# Patient Record
Sex: Female | Born: 1955 | Race: Black or African American | Hispanic: No | Marital: Single | Smoking: Former smoker
Health system: Southern US, Community
[De-identification: ages and names within clinical notes are randomized; demographics above are authoritative.]

## PROBLEM LIST (undated history)

## (undated) DIAGNOSIS — R7402 Elevation of levels of lactic acid dehydrogenase (LDH): Secondary | ICD-10-CM

## (undated) DIAGNOSIS — E785 Hyperlipidemia, unspecified: Secondary | ICD-10-CM

## (undated) DIAGNOSIS — E119 Type 2 diabetes mellitus without complications: Secondary | ICD-10-CM

## (undated) DIAGNOSIS — M1990 Unspecified osteoarthritis, unspecified site: Secondary | ICD-10-CM

## (undated) DIAGNOSIS — M2550 Pain in unspecified joint: Secondary | ICD-10-CM

## (undated) DIAGNOSIS — I10 Essential (primary) hypertension: Secondary | ICD-10-CM

## (undated) DIAGNOSIS — R740 Nonspecific elevation of levels of transaminase and lactic acid dehydrogenase [LDH]: Secondary | ICD-10-CM

## (undated) DIAGNOSIS — J45909 Unspecified asthma, uncomplicated: Secondary | ICD-10-CM

## (undated) DIAGNOSIS — E669 Obesity, unspecified: Secondary | ICD-10-CM

## (undated) DIAGNOSIS — R7401 Elevation of levels of liver transaminase levels: Secondary | ICD-10-CM

## (undated) DIAGNOSIS — K219 Gastro-esophageal reflux disease without esophagitis: Secondary | ICD-10-CM

## (undated) HISTORY — DX: Pain in unspecified joint: M25.50

## (undated) HISTORY — DX: Essential (primary) hypertension: I10

## (undated) HISTORY — DX: Hyperlipidemia, unspecified: E78.5

## (undated) HISTORY — PX: TOE SURGERY: SHX1073

## (undated) HISTORY — DX: Gastro-esophageal reflux disease without esophagitis: K21.9

## (undated) HISTORY — DX: Elevation of levels of lactic acid dehydrogenase (LDH): R74.02

## (undated) HISTORY — PX: ABDOMINAL HYSTERECTOMY: SHX81

## (undated) HISTORY — DX: Elevation of levels of liver transaminase levels: R74.01

## (undated) HISTORY — DX: Obesity, unspecified: E66.9

## (undated) HISTORY — DX: Nonspecific elevation of levels of transaminase and lactic acid dehydrogenase (ldh): R74.0

## (undated) HISTORY — PX: KNEE SURGERY: SHX244

## (undated) HISTORY — DX: Type 2 diabetes mellitus without complications: E11.9

## (undated) HISTORY — DX: Unspecified asthma, uncomplicated: J45.909

## (undated) HISTORY — DX: Unspecified osteoarthritis, unspecified site: M19.90

---

## 1997-10-12 ENCOUNTER — Ambulatory Visit (HOSPITAL_COMMUNITY): Admission: RE | Admit: 1997-10-12 | Discharge: 1997-10-12 | Payer: Self-pay | Admitting: Internal Medicine

## 1997-12-25 ENCOUNTER — Ambulatory Visit (HOSPITAL_COMMUNITY): Admission: RE | Admit: 1997-12-25 | Discharge: 1997-12-25 | Payer: Self-pay | Admitting: Internal Medicine

## 1997-12-25 ENCOUNTER — Encounter: Payer: Self-pay | Admitting: Neurology

## 1999-05-09 ENCOUNTER — Encounter: Admission: RE | Admit: 1999-05-09 | Discharge: 1999-05-09 | Payer: Self-pay | Admitting: Internal Medicine

## 1999-05-09 ENCOUNTER — Encounter: Payer: Self-pay | Admitting: Internal Medicine

## 1999-07-04 ENCOUNTER — Encounter: Payer: Self-pay | Admitting: Urology

## 1999-07-04 ENCOUNTER — Encounter: Admission: RE | Admit: 1999-07-04 | Discharge: 1999-07-04 | Payer: Self-pay | Admitting: Urology

## 1999-07-12 ENCOUNTER — Ambulatory Visit (HOSPITAL_BASED_OUTPATIENT_CLINIC_OR_DEPARTMENT_OTHER): Admission: RE | Admit: 1999-07-12 | Discharge: 1999-07-12 | Payer: Self-pay | Admitting: Urology

## 1999-07-12 ENCOUNTER — Encounter (INDEPENDENT_AMBULATORY_CARE_PROVIDER_SITE_OTHER): Payer: Self-pay | Admitting: Specialist

## 1999-08-15 ENCOUNTER — Other Ambulatory Visit: Admission: RE | Admit: 1999-08-15 | Discharge: 1999-08-15 | Payer: Self-pay | Admitting: Obstetrics and Gynecology

## 1999-10-10 ENCOUNTER — Ambulatory Visit (HOSPITAL_COMMUNITY): Admission: RE | Admit: 1999-10-10 | Discharge: 1999-10-10 | Payer: Self-pay | Admitting: Urology

## 1999-10-10 ENCOUNTER — Encounter: Payer: Self-pay | Admitting: Urology

## 2001-09-27 ENCOUNTER — Encounter: Admission: RE | Admit: 2001-09-27 | Discharge: 2001-09-27 | Payer: Self-pay | Admitting: Internal Medicine

## 2001-09-27 ENCOUNTER — Encounter: Payer: Self-pay | Admitting: Internal Medicine

## 2001-10-09 ENCOUNTER — Encounter: Payer: Self-pay | Admitting: Internal Medicine

## 2001-10-09 ENCOUNTER — Encounter: Admission: RE | Admit: 2001-10-09 | Discharge: 2001-10-09 | Payer: Self-pay | Admitting: Internal Medicine

## 2002-07-10 ENCOUNTER — Encounter: Payer: Self-pay | Admitting: Internal Medicine

## 2002-07-10 ENCOUNTER — Encounter: Admission: RE | Admit: 2002-07-10 | Discharge: 2002-07-10 | Payer: Self-pay | Admitting: Internal Medicine

## 2003-04-09 ENCOUNTER — Other Ambulatory Visit: Admission: RE | Admit: 2003-04-09 | Discharge: 2003-04-09 | Payer: Self-pay | Admitting: Obstetrics and Gynecology

## 2003-09-22 ENCOUNTER — Encounter: Admission: RE | Admit: 2003-09-22 | Discharge: 2003-09-22 | Payer: Self-pay | Admitting: Internal Medicine

## 2004-02-15 ENCOUNTER — Emergency Department (HOSPITAL_COMMUNITY): Admission: EM | Admit: 2004-02-15 | Discharge: 2004-02-15 | Payer: Self-pay | Admitting: Emergency Medicine

## 2004-03-29 ENCOUNTER — Encounter: Admission: RE | Admit: 2004-03-29 | Discharge: 2004-03-29 | Payer: Self-pay | Admitting: Internal Medicine

## 2005-01-30 ENCOUNTER — Emergency Department (HOSPITAL_COMMUNITY): Admission: EM | Admit: 2005-01-30 | Discharge: 2005-01-30 | Payer: Self-pay | Admitting: Emergency Medicine

## 2005-02-14 ENCOUNTER — Encounter: Admission: RE | Admit: 2005-02-14 | Discharge: 2005-02-14 | Payer: Self-pay | Admitting: Orthopaedic Surgery

## 2005-02-14 ENCOUNTER — Ambulatory Visit (HOSPITAL_BASED_OUTPATIENT_CLINIC_OR_DEPARTMENT_OTHER): Admission: RE | Admit: 2005-02-14 | Discharge: 2005-02-15 | Payer: Self-pay | Admitting: Orthopaedic Surgery

## 2005-08-14 ENCOUNTER — Emergency Department (HOSPITAL_COMMUNITY): Admission: EM | Admit: 2005-08-14 | Discharge: 2005-08-14 | Payer: Self-pay | Admitting: Emergency Medicine

## 2005-08-25 ENCOUNTER — Encounter: Admission: RE | Admit: 2005-08-25 | Discharge: 2005-08-25 | Payer: Self-pay | Admitting: Internal Medicine

## 2005-12-21 ENCOUNTER — Encounter: Admission: RE | Admit: 2005-12-21 | Discharge: 2005-12-21 | Payer: Self-pay | Admitting: Obstetrics and Gynecology

## 2006-01-05 ENCOUNTER — Encounter: Admission: RE | Admit: 2006-01-05 | Discharge: 2006-01-05 | Payer: Self-pay | Admitting: Obstetrics and Gynecology

## 2006-07-21 ENCOUNTER — Emergency Department (HOSPITAL_COMMUNITY): Admission: EM | Admit: 2006-07-21 | Discharge: 2006-07-21 | Payer: Self-pay | Admitting: Emergency Medicine

## 2006-07-22 ENCOUNTER — Emergency Department (HOSPITAL_COMMUNITY): Admission: EM | Admit: 2006-07-22 | Discharge: 2006-07-22 | Payer: Self-pay | Admitting: Emergency Medicine

## 2007-04-08 ENCOUNTER — Encounter: Admission: RE | Admit: 2007-04-08 | Discharge: 2007-04-08 | Payer: Self-pay | Admitting: Internal Medicine

## 2007-05-11 IMAGING — CR DG FOOT COMPLETE 3+V*L*
3 series · 3 of 3 positions shown · non-contrast
Comparison: none

CLINICAL DATA: Painful first toe. 
 LEFT FOOT ? 3 VIEW:

[t foot ap left]
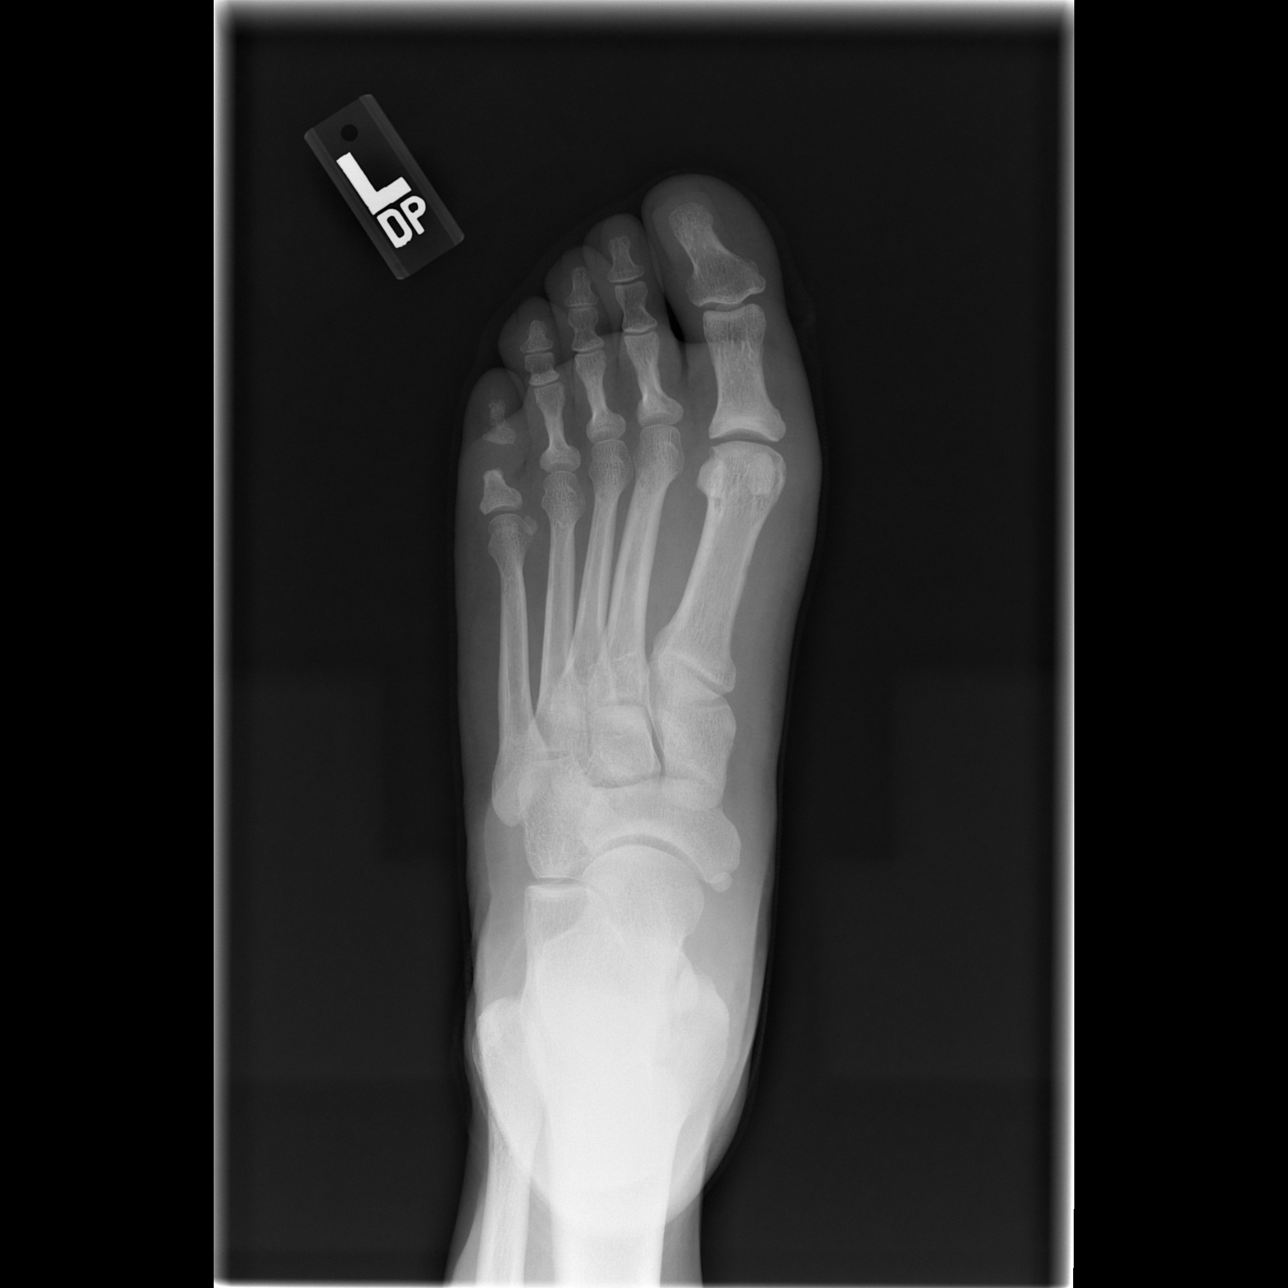

[t foot oblique left]
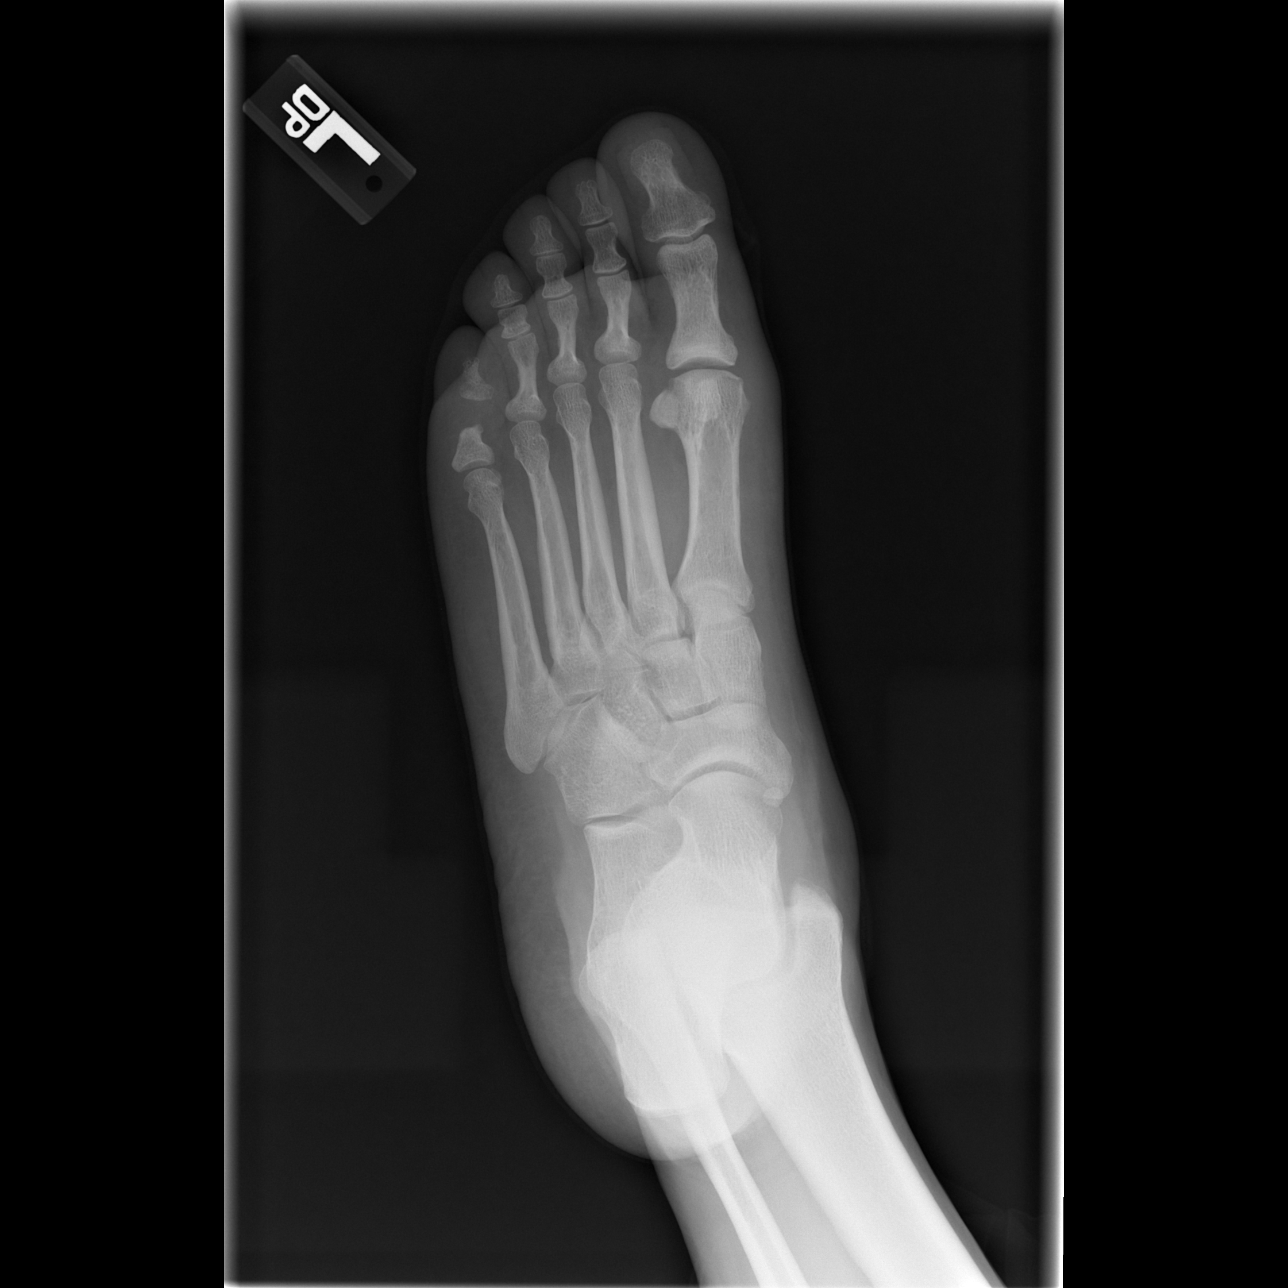

[t foot lat left]
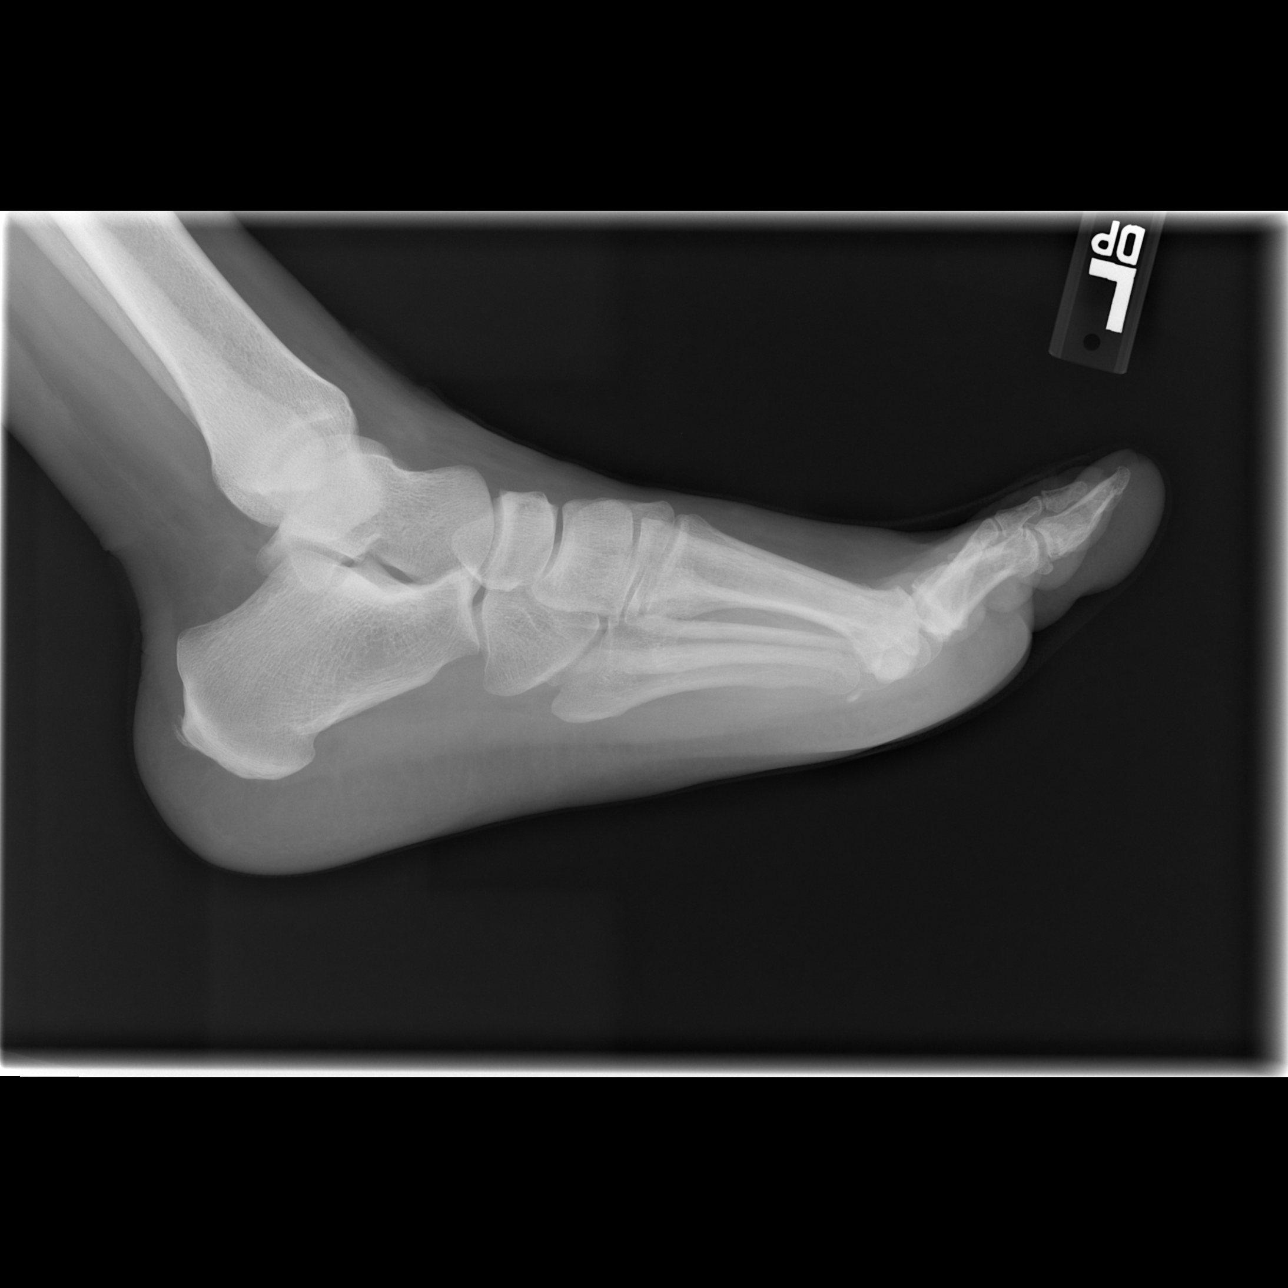

[3 of 3 positions shown; findings below may reference images not displayed]

FINDINGS: Three views of the left foot were obtained.  There is soft tissue swelling adjacent to the left first MTP joint, but no erosion is seen and no soft tissue calcification is noted.   MTP joints appear normal.  There is absence of the left fifth middle phalanx and hypoplasia of the proximal phalanx of the left fifth toe most likely congenital in origin.
IMPRESSION: Soft tissue swelling adjacent to the left first MTP joint.  No erosion or soft tissue calcification.

## 2007-11-21 ENCOUNTER — Encounter: Admission: RE | Admit: 2007-11-21 | Discharge: 2007-11-21 | Payer: Self-pay | Admitting: Internal Medicine

## 2008-07-27 ENCOUNTER — Encounter: Admission: RE | Admit: 2008-07-27 | Discharge: 2008-07-27 | Payer: Self-pay | Admitting: Internal Medicine

## 2010-05-04 ENCOUNTER — Encounter: Payer: Self-pay | Admitting: Internal Medicine

## 2010-06-03 NOTE — Op Note (Signed)
NAME:  Lauren Cantu, Lauren Cantu              ACCOUNT NO.:  [id number]   MEDICAL RECORD NO.:  [id number]          PATIENT TYPE:  AMB   LOCATION:  DSC                          FACILITY:  [facility]   PHYSICIAN:  Lubertha Basque. Dalldorf, M.D.DATE OF BIRTH:  Jun 13, 1955   DATE OF PROCEDURE:  02/14/2005  DATE OF DISCHARGE:                                 OPERATIVE REPORT   PREOPERATIVE DIAGNOSES:  1.  Right knee torn medial and torn lateral menisci.  2.  Right knee anterior cruciate ligament tear.  3.  Right knee posterior cruciate ligament tear.  4.  Right knee medial collateral ligament tear.  5.  Right knee lateral collateral ligament tear.   POSTOPERATIVE DIAGNOSES:  1.  Right knee torn medial and torn lateral menisci.  2.  Right knee anterior cruciate ligament tear.  3.  Right knee posterior cruciate ligament tear.  4.  Right knee medial collateral ligament tear.  5.  Right knee lateral collateral ligament tear.   PROCEDURES:  1.  Right knee partial medial and partial lateral meniscectomies.  2.  Right knee abrasion chondroplasty and debridement.   ANESTHESIA:  General.   ATTENDING SURGEON:  Lubertha Basque. Jerl Santos, M.D.   ASSISTANT:  Lindwood Qua, P.A.   INDICATIONS FOR PROCEDURE:  The patient is a 55 year old woman who fell  getting up on a horse a couple of weeks ago.  Her knee buckled.  We saw her  last week and we have been treating in her brace.  She did have an MRI scan  done which showed a displaced lateral meniscus tear stuck in the anterior  compartment of the knee.  She lacks extension and has pain with  weightbearing.  On exam, she had some significant ligamentous laxity and by  MRI scan, she has basically a knee dislocation.  She has a palpable regular  pulse in her foot.  After treatment in a brace for a couple of weeks, things  did seem to tighten up slightly and she had had good stability to varus  stress with some continued laxity to valgus stress and Lachman testing.  Our  hope  is at this point that we could unlock her knee and either repair or  remove the displaced lateral meniscus as well as address the medial  meniscus.  We will examine her under anesthesia.  My plan will be to do  these things and then likely treat her in a brace for a period of time and  then institute some physical therapy.  I will then see whether any  additional ligamentous reconstructions will be necessary in this pleasant  lady.  Informed operative consent was obtained after discussion of possible  complications of reaction to anesthesia, infection, DVT, and the probability  of further surgery being necessary.   DESCRIPTION OF PROCEDURE:  The patient was taken to an operating suite where  a general anesthetic was applied without difficulty.  She was positioned  supine and prepped in normal sterile fashion.  After the administration of  preop IV Kefzol, an arthroscopy of the right knee was performed through 2  inferior portals.  The medial compartment was notable for a bucket handle  tear of the medial meniscus in the avascular portion.  This was addressed  with about 20% partial medial meniscectomy involving the posterior and  middle horns.  There were no degenerative changes in this compartment.  The  lateral compartment exhibited a completely displaced posterior horn which  was macerated.  I tried reducing this and with some difficulty, we could  keep it in place, but there was also a radial tear which went back towards  the popliteal hiatus and it seemed that this tear was not really amenable to  repair.  We subsequently removed the posterior horn and a portion of the  middle horn, contouring this back to stable structures.  About a 50% partial  lateral meniscectomy was required.  There were no degenerative changes in  this compartment.  The ACL did appear to be torn, but was in continuity and  PCL seemed to have good tension to probing.  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  The  patella tracked well.  The knee was thoroughly irrigated followed by  placement of Marcaine with epinephrine and morphine.  We did examine her  knee under a general anesthetic prior to and after the knee arthroscopy.  She was again very stable to varus stress with the knee fully extended.  She  did have 2+ to 3+ laxity to valgus stress and 1+ to 2+ laxity in terms with  Lachman's testing.  Adaptic was placed over portals followed by dry gauze  and a loose Ace wrap.  She was placed back into her knee immobilizer.  Estimated blood loss and intraoperative fluids can be obtained from  anesthesia records.   DISPOSITION:  The patient was extubated in the operating room and taken to  recovery room in stable addition.  Plans were for her to stay overnight for  observation and pain control, and she will likely be discharged home in the  morning.      Lubertha Basque Jerl Santos, M.D.  Electronically Signed     PGD/MEDQ  D:  02/14/2005  T:  02/15/2005  Job:  [id number]

## 2010-06-03 NOTE — Op Note (Signed)
[facility]  Patient:    Lauren Cantu, Lauren Cantu                     MRN: [id number] Proc. Date: 07/12/99 Adm. Date:  [id number] Attending:  Lindaann Slough                           Operative Report  PREOPERATIVE DIAGNOSIS:  Gross hematuria, rule out bladder tumor.  POSTOPERATIVE DIAGNOSIS:  Gross hematuria, no bladder tumor.  OPERATION PERFORMED:  Cystoscopy, urethral dilation.  SURGEON:  Lindaann Slough, M.D.  ANESTHESIA:  General.  INDICATIONS FOR PROCEDURE:  The patient is a 55 year old female who gross painless hematuria about two months ago.  She was treated with antibiotics by her family physician and she had persistent microhematuria.  She has smoked about a pack a day for 30 years.  IVP showed normal upper tracts.  She is scheduled today for cystoscopy to rule out bladder tumor.  DESCRIPTION OF PROCEDURE:  Under general anesthesia, the patient was prepped and draped and placed in dorsal lithotomy position.  A #21 Wappler cystoscope was inserted in the bladder.  The bladder mucosa is normal.  There is no stone or tumor in the bladder.  The ureteral orifices were in normal position and shape with clear efflux.  There was no evidence of submucosal hemorrhage.  The bladder was then irrigated with normal saline and bladder washings were sent for cytology.  The cystoscope was then removed.  The urethra was dilated with #30 [place].  The patient tolerated the procedure well and left the operating room in satisfactory condition to post anesthesia care unit. DD:  07/12/99 TD:  07/13/99 Job: [id number]

## 2010-10-12 ENCOUNTER — Other Ambulatory Visit (HOSPITAL_COMMUNITY): Payer: Self-pay | Admitting: Cardiology

## 2010-10-12 ENCOUNTER — Ambulatory Visit
Admission: RE | Admit: 2010-10-12 | Discharge: 2010-10-12 | Disposition: A | Discharge status: Home / Self Care | Payer: BC Managed Care – PPO | Source: Ambulatory Visit | Attending: Internal Medicine | Admitting: Internal Medicine

## 2010-10-12 ENCOUNTER — Other Ambulatory Visit: Payer: Self-pay | Admitting: Internal Medicine

## 2010-10-12 DIAGNOSIS — R0602 Shortness of breath: Secondary | ICD-10-CM

## 2010-10-12 DIAGNOSIS — R52 Pain, unspecified: Secondary | ICD-10-CM

## 2010-10-13 ENCOUNTER — Emergency Department (HOSPITAL_COMMUNITY): Payer: BC Managed Care – PPO

## 2010-10-13 ENCOUNTER — Inpatient Hospital Stay (HOSPITAL_COMMUNITY)
Admission: EM | Admit: 2010-10-13 | Discharge: 2010-10-14 | DRG: 143 | Disposition: A | Discharge status: Home / Self Care | Payer: BC Managed Care – PPO | Attending: Cardiology | Admitting: Cardiology

## 2010-10-13 DIAGNOSIS — Z8249 Family history of ischemic heart disease and other diseases of the circulatory system: Secondary | ICD-10-CM

## 2010-10-13 DIAGNOSIS — Z888 Allergy status to other drugs, medicaments and biological substances status: Secondary | ICD-10-CM

## 2010-10-13 DIAGNOSIS — E785 Hyperlipidemia, unspecified: Secondary | ICD-10-CM | POA: Diagnosis present

## 2010-10-13 DIAGNOSIS — I10 Essential (primary) hypertension: Secondary | ICD-10-CM | POA: Diagnosis present

## 2010-10-13 DIAGNOSIS — M109 Gout, unspecified: Secondary | ICD-10-CM | POA: Diagnosis present

## 2010-10-13 DIAGNOSIS — J45909 Unspecified asthma, uncomplicated: Secondary | ICD-10-CM | POA: Diagnosis present

## 2010-10-13 DIAGNOSIS — Z882 Allergy status to sulfonamides status: Secondary | ICD-10-CM

## 2010-10-13 DIAGNOSIS — Z833 Family history of diabetes mellitus: Secondary | ICD-10-CM

## 2010-10-13 DIAGNOSIS — Z87891 Personal history of nicotine dependence: Secondary | ICD-10-CM

## 2010-10-13 DIAGNOSIS — R0789 Other chest pain: Principal | ICD-10-CM | POA: Diagnosis present

## 2010-10-13 DIAGNOSIS — E119 Type 2 diabetes mellitus without complications: Secondary | ICD-10-CM | POA: Diagnosis present

## 2010-10-13 DIAGNOSIS — K219 Gastro-esophageal reflux disease without esophagitis: Secondary | ICD-10-CM | POA: Diagnosis present

## 2010-10-13 LAB — CBC
MCH: 26.9 pg (ref 26.0–34.0)
MCHC: 34.9 g/dL (ref 30.0–36.0)
MCV: 77.2 fL — ABNORMAL LOW (ref 78.0–100.0)
Platelets: ADEQUATE 10*3/uL (ref 150–400)
RDW: 15.2 % (ref 11.5–15.5)

## 2010-10-13 LAB — PROTIME-INR
INR: 1.02 (ref 0.00–1.49)
Prothrombin Time: 13.6 s (ref 11.6–15.2)

## 2010-10-13 LAB — COMPREHENSIVE METABOLIC PANEL
ALT: 38 U/L — ABNORMAL HIGH (ref 0–35)
AST: 48 U/L — ABNORMAL HIGH (ref 0–37)
CO2: 28 mEq/L (ref 19–32)
Calcium: 10.3 mg/dL (ref 8.4–10.5)
Potassium: 4.1 mEq/L (ref 3.5–5.1)
Sodium: 142 mEq/L (ref 135–145)
Total Protein: 7.7 g/dL (ref 6.0–8.3)

## 2010-10-13 LAB — D-DIMER, QUANTITATIVE: D-Dimer, Quant: <0.22 ug/mL-FEU (ref 0.00–0.48)

## 2010-10-13 LAB — DIFFERENTIAL
Lymphocytes Relative: 40 % (ref 12–46)
Lymphs Abs: 3.4 10*3/uL (ref 0.7–4.0)
Metamyelocytes Relative: 0 %
Monocytes Absolute: 0.6 10*3/uL (ref 0.1–1.0)
Promyelocytes Absolute: 0 %
nRBC: 0 /100 WBC

## 2010-10-13 LAB — POCT I-STAT TROPONIN I: Troponin i, poc: 0.02 ng/mL (ref 0.00–0.08)

## 2010-10-13 LAB — HEMOGLOBIN A1C
Hgb A1c MFr Bld: 6.1 % — ABNORMAL HIGH (ref <5.7)
Mean Plasma Glucose: 128 mg/dL — ABNORMAL HIGH (ref <117)

## 2010-10-13 LAB — GLUCOSE, CAPILLARY: Glucose-Capillary: 140 mg/dL — ABNORMAL HIGH (ref 70–99)

## 2010-10-13 LAB — APTT: aPTT: 38 s — ABNORMAL HIGH (ref 24–37)

## 2010-10-14 ENCOUNTER — Inpatient Hospital Stay (HOSPITAL_COMMUNITY): Payer: BC Managed Care – PPO

## 2010-10-14 LAB — BASIC METABOLIC PANEL
CO2: 25 mEq/L (ref 19–32)
GFR calc non Af Amer: >60 mL/min (ref >60)
Glucose, Bld: 106 mg/dL — ABNORMAL HIGH (ref 70–99)
Potassium: 3.8 mEq/L (ref 3.5–5.1)
Sodium: 142 mEq/L (ref 135–145)

## 2010-10-14 LAB — CBC
HCT: 33.2 % — ABNORMAL LOW (ref 36.0–46.0)
Hemoglobin: 11.3 g/dL — ABNORMAL LOW (ref 12.0–15.0)
MCH: 27.2 pg (ref 26.0–34.0)
MCHC: 35 g/dL (ref 30.0–36.0)
Platelets: 261 10*3/uL (ref 150–400)
RDW: 15.2 % (ref 11.5–15.5)
RDW: 15.2 % (ref 11.5–15.5)
WBC: 8.2 10*3/uL (ref 4.0–10.5)

## 2010-10-14 LAB — HEPARIN LEVEL (UNFRACTIONATED): Heparin Unfractionated: 0.24 IU/mL — ABNORMAL LOW (ref 0.30–0.70)

## 2010-10-14 LAB — GLUCOSE, CAPILLARY
Glucose-Capillary: 122 mg/dL — ABNORMAL HIGH (ref 70–99)
Glucose-Capillary: 133 mg/dL — ABNORMAL HIGH (ref 70–99)

## 2010-10-14 MED ORDER — TECHNETIUM TC 99M TETROFOSMIN IV KIT
10.0000 | PACK | Freq: Once | INTRAVENOUS | Status: AC | PRN
  Administered 2010-10-14: 10 via INTRAVENOUS

## 2010-10-14 MED ORDER — TECHNETIUM TC 99M TETROFOSMIN IV KIT
30.0000 | PACK | Freq: Once | INTRAVENOUS | Status: AC | PRN
  Administered 2010-10-14: 30 via INTRAVENOUS

## 2010-10-19 ENCOUNTER — Other Ambulatory Visit (HOSPITAL_COMMUNITY): Payer: BC Managed Care – PPO

## 2010-11-01 LAB — I-STAT 8, (EC8 V) (CONVERTED LAB)
Bicarbonate: 27.3 — ABNORMAL HIGH
Glucose, Bld: 88
TCO2: 29
pCO2, Ven: 41.7 — ABNORMAL LOW
pH, Ven: 7.424 — ABNORMAL HIGH

## 2010-11-01 LAB — POCT CARDIAC MARKERS
CKMB, poc: 1.2
Myoglobin, poc: 84.8
Operator id: 151321

## 2010-11-01 LAB — DIFFERENTIAL
Basophils Absolute: 0
Basophils Relative: 1
Monocytes Relative: 6
Neutro Abs: 4.2
Neutrophils Relative %: 56

## 2010-11-01 LAB — POCT I-STAT CREATININE
Creatinine, Ser: 0.9
Operator id: 151321

## 2010-11-01 LAB — CBC
MCHC: 33
Platelets: 328
RBC: 4.76

## 2010-11-10 NOTE — Discharge Summary (Signed)
NAMEMarland Kitchen  Lauren Cantu, Lauren Cantu NO.:  [id number]  MEDICAL RECORD NO.:  [id number]  LOCATION:  2037                         FACILITY:  [facility]  PHYSICIAN:  Eduardo Osier. Sharyn Lull, M.D. DATE OF BIRTH:  Dec 29, 1955  DATE OF ADMISSION:  10/13/2010 DATE OF DISCHARGE:  10/14/2010                              DISCHARGE SUMMARY   ADMITTING DIAGNOSES:  Recurrent chest pain rule out myocardial infarction, hypertension, non-insulin-dependent diabetes mellitus, hypercholesteremia, morbid obesity, history of bronchial asthma, gastroesophageal reflux disease, degenerative joint disease.  DISCHARGE DIAGNOSES:  Status post chest pain myocardial infarction ruled out, negative Lexiscan Myoview, hypertension, non-insulin-dependent diabetes mellitus, hypercholesteremia, morbid obesity, history of bronchial asthma, gastroesophageal reflux disease, degenerative joint disease.  DIET:  Low salt, low cholesterol 1800 calories ADA diet.  The patient has been advised to monitor blood pressure and blood sugar daily.  ACTIVITY:  As tolerated.  FOLLOWUP:  With me in 1 week.  CONDITION AT DISCHARGE:  Stable.  DISCHARGE HOME MEDICATIONS: 1. Allopurinol 300 mg 1 tablet daily. 2. Amlodipine 10 mg 1 tablet every morning. 3. Benazepril 40 mg 1 tablet daily. 4. Calcium citrate with vitamin D 2 tablets daily as before. 5. Calcium, magnesium, zinc over-the-counter 1 tablet every evening as     before. 6. Flovent Diskus 100 mcg 1 puff twice daily as needed as before. 7. Furosemide 20 mg 1 tablet every morning as before. 8. Gabapentin 100 mg 1 capsule at bedtime. 9. Lansoprazole 30 mg 1 capsule daily. 10.Lovastatin 20 mg 1 tablet daily. 11.Metformin 500 mg 1 tablet daily. 12.Multivitamin 1 tablet daily. 13.Nitrostat 0.4 mg sublingual use as directed. 14.Potassium chloride 10 mEq 1 tablet twice daily. 15.Pro-Air inhaler 2 puffs every 6 hours as needed. 16.Singulair 10 mg 1 tablet daily.  BRIEF HISTORY  AND HOSPITAL COURSE:  Lauren Cantu is 55 year old black female with past medical history significant for hypertension, non- insulin-dependent diabetes mellitus, hypercholesteremia, morbid obesity, GERD, degenerative joint disease.  She came to the ER complaining of retrosternal chest pain radiating to the left shoulder grade 4/10 associated with mild shortness of breath.  The patient received 1 sublingual nitro and aspirin with partial relief while at work.  She called EMS and came to the ER.  The patient gives history of exertional dyspnea associated with feeling weak and tired.  The patient was seen in my office 2 days ago and was scheduled for [facility] as an outpatient.  The patient denies any relation of chest pain to food, breathing, or movement.  Denies PND, orthopnea, leg swelling.  Denies palpitation, lightheadedness or syncope.  Denies any cough, fever or chills.  PAST MEDICAL HISTORY:  As above.  PAST SURGICAL HISTORY:  She had right knee surgery in the past, had C- section in the past, had tubal ligation in the past, had right and left toe surgery in the past.  ALLERGIES:  She is allergic to CODEINE and CELEBREX.  MEDICATIONS:  At home, she was on amlodipine, benazepril, Lasix, potassium chloride, lovastatin, allopurinol, Prevacid, gabapentin, calcium, magnesium, zinc, Singulair, Flovent, Pro-Air and Centrum Silver.  SOCIAL HISTORY:  She is single, has 1 child, smoked half pack per day for 36 years, quit 3 years ago.  No history of alcohol abuse.  She works on news and records.  FAMILY HISTORY:  Father is alive.  He is 58 years of age.  He is hypertensive.  Mother is alive.  She has coronary artery disease, had CABG in the past.  She is hypertensive and diabetic, also had MI in the past.  She had 3 sisters, 1 sister is hypertensive, 1 brother is in good health.  PHYSICAL EXAMINATION:  GENERAL:  She is alert, awake, oriented x3, in no acute distress. VITAL  SIGNS:  Blood pressure was 112/60, pulse was 69 and regular. EYES:  Conjunctivae was pink. NECK:  Supple.  No JVD.  No bruit. LUNGS:  Clear to auscultation without rhonchi or rales. CARDIOVASCULAR:  S1, S2 was normal.  There was soft systolic murmur. There was no S3 or S4 gallop. ABDOMEN:  Soft.  Bowel sounds were present.  Nontender. EXTREMITIES:  There is no clubbing, cyanosis, or edema.  LABORATORIES:  EKG showed normal sinus rhythm with no acute ischemic changes.  Her other labs: Hemoglobin was 12.5, hematocrit 35.8, white count of 8.4.  Sodium was 142, potassium 4.1, chloride 105, bicarb 28, glucose 101, BUN 12, creatinine 0.64.  Hemoglobin A1c was 6.1. Cholesterol was 173, triglycerides 159, HDL 54, LDL was 87, which was slightly elevated.  Two sets of cardiac enzymes were negative.  Her chest x-ray showed cardiac enlargement with no active cardiopulmonary disease.  Lexiscan, Myoview scan showed no evidence of myocardial ischemia or infarction, normal left ventricular wall motion, ejection fraction of 72%.  BRIEF HOSPITAL COURSE:  The patient was admitted to telemetry unit.  MI was ruled out by serial enzymes and EKG.  The patient did not had any further episodes of anginal chest pain during the hospital stay.  The patient subsequently underwent Lexiscan Myoview stress test which showed no evidence of reversible ischemia.  The patient has been ambulating in room without any problems.  The patient will be discharged home on above medications and will be followed up in my office in 1 week.  We will get GI workup if she continues to have recurrent chest pain as outpatient.     Eduardo Osier. Sharyn Lull, M.D.     MNH/MEDQ  D:  10/26/2010  T:  10/27/2010  Job:  [id number]  Electronically Signed by Rinaldo Cloud M.D. on 11/10/2010 10:02:55 AM

## 2011-01-03 ENCOUNTER — Other Ambulatory Visit: Payer: Self-pay | Admitting: Cardiology

## 2011-05-31 ENCOUNTER — Other Ambulatory Visit: Payer: Self-pay | Admitting: Orthopedic Surgery

## 2011-05-31 ENCOUNTER — Ambulatory Visit
Admission: RE | Admit: 2011-05-31 | Discharge: 2011-05-31 | Disposition: A | Discharge status: Home / Self Care | Payer: BC Managed Care – PPO | Source: Ambulatory Visit | Attending: Orthopedic Surgery | Admitting: Orthopedic Surgery

## 2011-05-31 DIAGNOSIS — M542 Cervicalgia: Secondary | ICD-10-CM

## 2011-05-31 DIAGNOSIS — M5412 Radiculopathy, cervical region: Secondary | ICD-10-CM

## 2011-06-01 ENCOUNTER — Other Ambulatory Visit: Payer: Self-pay | Admitting: Internal Medicine

## 2011-06-01 DIAGNOSIS — Z1231 Encounter for screening mammogram for malignant neoplasm of breast: Secondary | ICD-10-CM

## 2011-06-15 ENCOUNTER — Ambulatory Visit
Admission: RE | Admit: 2011-06-15 | Discharge: 2011-06-15 | Disposition: A | Discharge status: Home / Self Care | Payer: BC Managed Care – PPO | Source: Ambulatory Visit | Attending: Internal Medicine | Admitting: Internal Medicine

## 2011-06-15 DIAGNOSIS — Z1231 Encounter for screening mammogram for malignant neoplasm of breast: Secondary | ICD-10-CM

## 2011-06-15 LAB — HM MAMMOGRAPHY

## 2011-11-22 ENCOUNTER — Ambulatory Visit (INDEPENDENT_AMBULATORY_CARE_PROVIDER_SITE_OTHER): Payer: BC Managed Care – PPO | Admitting: Obstetrics and Gynecology

## 2011-11-22 ENCOUNTER — Encounter: Payer: Self-pay | Admitting: Obstetrics and Gynecology

## 2011-11-22 VITALS — BP 120/60 | HR 70 | Resp 18 | Ht 62.0 in | Wt 219.0 lb

## 2011-11-22 DIAGNOSIS — Z01419 Encounter for gynecological examination (general) (routine) without abnormal findings: Secondary | ICD-10-CM

## 2011-11-22 NOTE — Progress Notes (Signed)
Subjective:    Lauren Cantu is a 56 y.o. female G2P0010 who presents for annual exam. The patient complaints of increased weight. The patient is status post hysterectomy.  She has not been seen in our office since 2007.  The following portions of the patient's history were reviewed and updated as appropriate: allergies, current medications, past family history, past medical history, past social history, past surgical history and problem list.  Review of Systems Pertinent items are noted in HPI. Gastrointestinal:No change in bowel habits, no abdominal pain, no rectal bleeding Genitourinary:negative for dysuria, frequency, hematuria, nocturia and urinary incontinence    Objective:     BP 120/60  Pulse 70  Resp 18  Ht 5\' 2"  (1.575 m)  Wt 219 lb (99.338 kg)  BMI 40.06 kg/m2  Weight:  Wt Readings from Last 1 Encounters:  11/22/11 219 lb (99.338 kg)     BMI: Body mass index is 40.06 kg/(m^2). General Appearance: Alert, appropriate appearance for age. No acute distress HEENT: Grossly normal Neck / Thyroid: Supple, no masses, nodes or enlargement Lungs: clear to auscultation bilaterally Back: No CVA tenderness Breast Exam: No masses or nodes.No dimpling, nipple retraction or discharge. Cardiovascular: Regular rate and rhythm. S1, S2, no murmur Gastrointestinal: Soft, non-tender, no masses or organomegaly  ++++++++++++++++++++++++++++++++++++++++++++++++++++++++  Pelvic Exam: External genitalia: normal general appearance Vaginal: normal without tenderness, induration or masses and relaxation noted Cervix: absent Adnexa: normal bimanual exam Uterus: absent Rectovaginal: normal rectal, no masses  ++++++++++++++++++++++++++++++++++++++++++++++++++++++++  Lymphatic Exam: Non-palpable nodes in neck, clavicular, axillary, or inguinal regions  Psychiatric: Alert and oriented, appropriate affect.     Assessment:    Normal gyn exam   Overweight or obese: Yes  Pelvic relaxation:  Yes  Menopausal symptoms: No. Severe: No.   Plan:    Mammogram.   Follow-up:  for annual exam  The updated Pap smear screening guidelines were discussed with the patient. The patient requested that I obtain a Pap smear: No.  Kegel exercises discussed: Yes.  Proper diet and regular exercise were reviewed.  Annual mammograms recommended starting at age 47. Proper breast care was discussed.  Screening colonoscopy is recommended beginning at age 13.  Regular health maintenance was reviewed.  Sleep hygiene was discussed.  Adequate calcium and vitamin D intake was emphasized.  Leonard Schwartz M.D.   Regular Periods: Hysterectomy Mammogram: yes  Monthly Breast Ex.: yes Exercise: no  Tetanus < 10 years: yes Seatbelts: yes  NI. Bladder Functn.: yes Abuse at home: no  Daily BM's: yes Stressful Work: yes  Healthy Diet: yes Sigmoid-Colonoscopy: None  Calcium: yes Medical problems this year: None   LAST PAP:2007 WNL  Contraception: Hysterectomy  Mammogram:  03/2011 WNL  PCP: Willey Blade  PMH: None  FMH: None  Last Bone Scan: None

## 2012-01-05 LAB — BASIC METABOLIC PANEL
BUN: 15 mg/dL (ref 4–21)
Sodium: 140 mmol/L (ref 137–147)

## 2012-01-05 LAB — HEMOGLOBIN A1C: Hgb A1c MFr Bld: 6.2 % — AB (ref 4.0–6.0)

## 2012-01-05 LAB — HEPATIC FUNCTION PANEL
ALT: 22 U/L (ref 7–35)
Alkaline Phosphatase: 111 U/L (ref 25–125)
Bilirubin, Total: 0.5 mg/dL

## 2012-03-05 ENCOUNTER — Other Ambulatory Visit: Payer: Self-pay | Admitting: Gastroenterology

## 2012-03-07 ENCOUNTER — Encounter: Payer: Self-pay | Admitting: Hematology

## 2012-03-15 ENCOUNTER — Ambulatory Visit
Admission: RE | Admit: 2012-03-15 | Discharge: 2012-03-15 | Disposition: A | Discharge status: Home / Self Care | Payer: BC Managed Care – PPO | Source: Ambulatory Visit | Attending: Gastroenterology | Admitting: Gastroenterology

## 2013-11-17 ENCOUNTER — Encounter: Payer: Self-pay | Admitting: Hematology

## 2014-02-02 ENCOUNTER — Ambulatory Visit
Admission: RE | Admit: 2014-02-02 | Discharge: 2014-02-02 | Disposition: A | Discharge status: Home / Self Care | Payer: BC Managed Care – PPO | Source: Ambulatory Visit | Attending: Internal Medicine | Admitting: Internal Medicine

## 2014-02-02 ENCOUNTER — Other Ambulatory Visit: Payer: Self-pay | Admitting: Internal Medicine

## 2014-02-02 DIAGNOSIS — Z87891 Personal history of nicotine dependence: Secondary | ICD-10-CM

## 2014-03-03 ENCOUNTER — Other Ambulatory Visit: Payer: Self-pay | Admitting: Internal Medicine

## 2014-03-03 DIAGNOSIS — Z1231 Encounter for screening mammogram for malignant neoplasm of breast: Secondary | ICD-10-CM

## 2014-03-05 ENCOUNTER — Encounter (INDEPENDENT_AMBULATORY_CARE_PROVIDER_SITE_OTHER): Payer: Self-pay

## 2014-03-05 ENCOUNTER — Ambulatory Visit
Admission: RE | Admit: 2014-03-05 | Discharge: 2014-03-05 | Disposition: A | Discharge status: Home / Self Care | Payer: BLUE CROSS/BLUE SHIELD | Source: Ambulatory Visit | Attending: Internal Medicine | Admitting: Internal Medicine

## 2014-03-05 DIAGNOSIS — Z1231 Encounter for screening mammogram for malignant neoplasm of breast: Secondary | ICD-10-CM

## 2015-02-11 ENCOUNTER — Emergency Department (HOSPITAL_COMMUNITY)
Admission: EM | Admit: 2015-02-11 | Discharge: 2015-02-11 | Disposition: A | Discharge status: Home / Self Care | Payer: BLUE CROSS/BLUE SHIELD | Attending: Emergency Medicine | Admitting: Emergency Medicine

## 2015-02-11 ENCOUNTER — Encounter (HOSPITAL_COMMUNITY): Payer: Self-pay | Admitting: *Deleted

## 2015-02-11 DIAGNOSIS — Z79899 Other long term (current) drug therapy: Secondary | ICD-10-CM | POA: Diagnosis not present

## 2015-02-11 DIAGNOSIS — E785 Hyperlipidemia, unspecified: Secondary | ICD-10-CM | POA: Insufficient documentation

## 2015-02-11 DIAGNOSIS — M25512 Pain in left shoulder: Secondary | ICD-10-CM | POA: Diagnosis not present

## 2015-02-11 DIAGNOSIS — E119 Type 2 diabetes mellitus without complications: Secondary | ICD-10-CM | POA: Insufficient documentation

## 2015-02-11 DIAGNOSIS — J45909 Unspecified asthma, uncomplicated: Secondary | ICD-10-CM | POA: Diagnosis not present

## 2015-02-11 DIAGNOSIS — F17200 Nicotine dependence, unspecified, uncomplicated: Secondary | ICD-10-CM | POA: Diagnosis not present

## 2015-02-11 DIAGNOSIS — E669 Obesity, unspecified: Secondary | ICD-10-CM | POA: Insufficient documentation

## 2015-02-11 DIAGNOSIS — I10 Essential (primary) hypertension: Secondary | ICD-10-CM | POA: Diagnosis not present

## 2015-02-11 DIAGNOSIS — Z7951 Long term (current) use of inhaled steroids: Secondary | ICD-10-CM | POA: Diagnosis not present

## 2015-02-11 DIAGNOSIS — Z7984 Long term (current) use of oral hypoglycemic drugs: Secondary | ICD-10-CM | POA: Diagnosis not present

## 2015-02-11 NOTE — ED Notes (Signed)
Pt reports seeing an ortho for tendonitis in her left arm. Pt received a shot at that time. States that pain has continued.

## 2015-02-11 NOTE — Discharge Instructions (Signed)
Cryotherapy Cryotherapy is when you put ice on your injury. Ice helps lessen pain and puffiness (swelling) after an injury. Ice works the best when you start using it in the first 24 to 48 hours after an injury. HOME CARE  Put a dry or damp towel between the ice pack and your skin.  You may press gently on the ice pack.  Leave the ice on for no more than 10 to 20 minutes at a time.  Check your skin after 5 minutes to make sure your skin is okay.  Rest at least 20 minutes between ice pack uses.  Stop using ice when your skin loses feeling (numbness).  Do not use ice on someone who cannot tell you when it hurts. This includes small children and people with memory problems (dementia). GET HELP RIGHT AWAY IF:  You have white spots on your skin.  Your skin turns blue or pale.  Your skin feels waxy or hard.  Your puffiness gets worse. MAKE SURE YOU:   Understand these instructions.  Will watch your condition.  Will get help right away if you are not doing well or get worse.   This information is not intended to replace advice given to you by your health care provider. Make sure you discuss any questions you have with your health care provider.   Document Released: 06/21/2007 Document Revised: 03/27/2011 Document Reviewed: 08/25/2010 Elsevier Interactive Patient Education 2016 [employer]  Shoulder Pain The shoulder is the joint that connects your arms to your body. The bones that form the shoulder joint include the upper arm bone (humerus), the shoulder blade (scapula), and the collarbone (clavicle). The top of the humerus is shaped like a ball and fits into a rather flat socket on the scapula (glenoid cavity). A combination of muscles and strong, fibrous tissues that connect muscles to bones (tendons) support your shoulder joint and hold the ball in the socket. Small, fluid-filled sacs (bursae) are located in different areas of the joint. They act as cushions between the bones  and the overlying soft tissues and help reduce friction between the gliding tendons and the bone as you move your arm. Your shoulder joint allows a wide range of motion in your arm. This range of motion allows you to do things like scratch your back or throw a ball. However, this range of motion also makes your shoulder more prone to pain from overuse and injury. Causes of shoulder pain can originate from both injury and overuse and usually can be grouped in the following four categories:  Redness, swelling, and pain (inflammation) of the tendon (tendinitis) or the bursae (bursitis).  Instability, such as a dislocation of the joint.  Inflammation of the joint (arthritis).  Broken bone (fracture). HOME CARE INSTRUCTIONS   Apply ice to the sore area.  Put ice in a plastic bag.  Place a towel between your skin and the bag.  Leave the ice on for 15-20 minutes, 3-4 times per day for the first 2 days, or as directed by your health care provider.  Stop using cold packs if they do not help with the pain.  If you have a shoulder sling or immobilizer, wear it as long as your caregiver instructs. Only remove it to shower or bathe. Move your arm as little as possible, but keep your hand moving to prevent swelling.  Squeeze a soft ball or foam pad as much as possible to help prevent swelling.  Only take over-the-counter or prescription medicines for  pain, discomfort, or fever as directed by your caregiver. SEEK MEDICAL CARE IF:   Your shoulder pain increases, or new pain develops in your arm, hand, or fingers.  Your hand or fingers become cold and numb.  Your pain is not relieved with medicines. SEEK IMMEDIATE MEDICAL CARE IF:   Your arm, hand, or fingers are numb or tingling.  Your arm, hand, or fingers are significantly swollen or turn white or blue. MAKE SURE YOU:   Understand these instructions.  Will watch your condition.  Will get help right away if you are not doing well or get  worse.   This information is not intended to replace advice given to you by your health care provider. Make sure you discuss any questions you have with your health care provider.   Keep scheduled appointment to see Dr. Venancio Poisson take on 02/16/2015. Continue taking home Tylenol 3 for pain. Apply ice to affected area. Keep arm elevated. Return to the emergency department if you experience severe worsening of her symptoms, increased swelling in your arm, numbness or tingling in your arm.

## 2015-02-13 NOTE — ED Provider Notes (Signed)
CSN: [id number]     Arrival date & time 02/11/15  0802 History   First MD Initiated Contact with Patient 02/11/15 0827     Chief Complaint  Patient presents with  . Arm Pain     (Consider location/radiation/quality/duration/timing/severity/associated sxs/prior Treatment) HPI   Lauren Cantu is a 60 y.o F that presents to the ED today c/o left shoulder pain. Pt states that she was seen by an orthopedic provider in December 2016 and was diagnosed with tendonitis in her left shoulder. She received a cortisone injection that pt states provided relief for 3 days. She has also been doing physical therapy with minimal relief. Pt states that she has been calling her ortho providers office multiple times but has not been able to get through to see them. No new trauma or injury has occurred. Pain is worsened with movement and raising her hand above her head. Denies swelling, paresthesias,   Past Medical History  Diagnosis Date  . Essential hypertension, malignant   . Type II or unspecified type diabetes mellitus without mention of complication, not stated as uncontrolled   . Pain in joint, site unspecified   . Extrinsic asthma, unspecified   . Obesity, unspecified   . Nonspecific elevation of levels of transaminase or lactic acid dehydrogenase (LDH)   . Hyperlipidemia    No past surgical history on file. No family history on file. Social History  Substance Use Topics  . Smoking status: Current Every Day Smoker  . Smokeless tobacco: None  . Alcohol Use: No   OB History    Gravida Para Term Preterm AB TAB SAB Ectopic Multiple Living   Review of Systems  All other systems reviewed and are negative.     Allergies  Colchicine; Sulfa antibiotics; and Celebrex  Home Medications   Prior to Admission medications   Medication Sig Start Date End Date Taking? Authorizing Provider  Albuterol Sulfate (PROAIR HFA IN) Inhale 90 mcg into the lungs as needed. 8.5 gm      Historical Provider, MD  allopurinol (ZYLOPRIM) 300 MG tablet Take 300 mg by mouth daily.      Historical Provider, MD  AMLODIPINE BESYLATE PO Take 10 mg by mouth daily.      Historical Provider, MD  BENAZEPRIL HCL PO Take 40 mg by mouth daily. 40 mg tab myl     Historical Provider, MD  Calcium Carbonate-Vitamin D (CALTRATE 600+D PO) Take 2 tablets by mouth daily.      Historical Provider, MD  CALCIUM-MAGNESIUM-ZINC PO Take by mouth daily.      Historical Provider, MD  celecoxib (CELEBREX) 200 MG capsule Take 200 mg by mouth daily. X 10 days     Historical Provider, MD  Fluticasone Propionate  HFA (FLOVENT HFA IN) Inhale 100 mg into the lungs 2 (two) times daily.      Historical Provider, MD  furosemide (LASIX) 20 MG tablet Take 20 mg by mouth daily.      Historical Provider, MD  gabapentin (NEURONTIN) 100 MG capsule Take 100 mg by mouth at bedtime.    Historical Provider, MD  GLUCOSAMINE PO Take by mouth. OTC     Historical Provider, MD  lansoprazole (PREVACID) 30 MG capsule Take 30 mg by mouth daily. Lansoprazole DR     Historical Provider, MD  LOVASTATIN PO Take 20 mg by mouth daily.     Historical Provider, MD  METFORMIN HCL PO Take  500 mg by mouth daily.      Historical Provider, MD  montelukast (SINGULAIR) 10 MG tablet Take 10 mg by mouth daily.      Historical Provider, MD  Multiple Vitamins-Minerals (CENTRUM SILVER PO) Take by mouth daily.      Historical Provider, MD  POTASSIUM CHLORIDE PO Take 10 mg by mouth 2 (two) times daily. MEq SR    Historical Provider, MD   BP 149/84 mmHg  Pulse 98  Temp(Src) 98.6 F (37 C) (Oral)  Resp 20  SpO2 96% Physical Exam  Constitutional: She is oriented to person, place, and time. She appears well-developed and well-nourished. No distress.  HENT:  Head: Normocephalic and atraumatic.  Eyes: Conjunctivae are normal. Right eye exhibits no discharge. Left eye exhibits no discharge. No scleral icterus.  Cardiovascular: Normal rate.    Pulmonary/Chest: Effort normal.  Musculoskeletal:       Left shoulder: She exhibits decreased range of motion ( limited by pain) and pain. She exhibits no bony tenderness, no swelling, no crepitus, no deformity, normal pulse and normal strength.  Neurological: She is alert and oriented to person, place, and time. Coordination normal.  Skin: Skin is warm and dry. No rash noted. She is not diaphoretic. No erythema. No pallor.  Psychiatric: She has a normal mood and affect. Her behavior is normal.  Nursing note and vitals reviewed.   ED Course  Procedures (including critical care time) Labs Review Labs Reviewed - No data to display  Imaging Review No results found. I have personally reviewed and evaluated these images and lab results as part of my medical decision-making.   EKG Interpretation None      MDM   Final diagnoses:  Left shoulder pain    Pt recently diagnosed with left shoulder tendonitis by orthopedic provider in December 2016. Pt received cortisone shot which only provided relief for 3 days. Symptoms remain unchanged. Pt has not been able to get in to see ortho doctor to follow up since. I spoke with pts orthopedic office and was able to schedule an appointment for pt on 02/16/2015. No new trauma or injury. No additional imaging indicated at this time. RICE precautions recommended. Return precautions outlined in patient discharge instructions.     Lester Kinsman [place], PA-C 02/13/15 1217  Loren Racer, MD 02/17/15 [phone]

## 2019-06-11 ENCOUNTER — Encounter (HOSPITAL_COMMUNITY): Payer: Self-pay | Admitting: Emergency Medicine

## 2019-06-11 ENCOUNTER — Emergency Department (HOSPITAL_COMMUNITY): Payer: Medicare Other

## 2019-06-11 ENCOUNTER — Emergency Department (HOSPITAL_COMMUNITY)
Admission: EM | Admit: 2019-06-11 | Discharge: 2019-06-12 | Disposition: A | Discharge status: Home / Self Care | Payer: Medicare Other | Attending: Emergency Medicine | Admitting: Emergency Medicine

## 2019-06-11 DIAGNOSIS — Z79899 Other long term (current) drug therapy: Secondary | ICD-10-CM | POA: Diagnosis not present

## 2019-06-11 DIAGNOSIS — I10 Essential (primary) hypertension: Secondary | ICD-10-CM | POA: Insufficient documentation

## 2019-06-11 DIAGNOSIS — J45909 Unspecified asthma, uncomplicated: Secondary | ICD-10-CM | POA: Insufficient documentation

## 2019-06-11 DIAGNOSIS — Z7984 Long term (current) use of oral hypoglycemic drugs: Secondary | ICD-10-CM | POA: Insufficient documentation

## 2019-06-11 DIAGNOSIS — R1013 Epigastric pain: Secondary | ICD-10-CM | POA: Diagnosis present

## 2019-06-11 DIAGNOSIS — R1011 Right upper quadrant pain: Secondary | ICD-10-CM | POA: Diagnosis not present

## 2019-06-11 DIAGNOSIS — F17210 Nicotine dependence, cigarettes, uncomplicated: Secondary | ICD-10-CM | POA: Diagnosis not present

## 2019-06-11 DIAGNOSIS — T7840XA Allergy, unspecified, initial encounter: Secondary | ICD-10-CM

## 2019-06-11 DIAGNOSIS — E119 Type 2 diabetes mellitus without complications: Secondary | ICD-10-CM | POA: Insufficient documentation

## 2019-06-11 LAB — LIPASE, BLOOD: Lipase: 30 U/L (ref 11–51)

## 2019-06-11 LAB — COMPREHENSIVE METABOLIC PANEL
ALT: 31 U/L (ref 0–44)
AST: 51 U/L — ABNORMAL HIGH (ref 15–41)
Albumin: 3.9 g/dL (ref 3.5–5.0)
Alkaline Phosphatase: 103 U/L (ref 38–126)
Anion gap: 11 (ref 5–15)
BUN: 12 mg/dL (ref 8–23)
CO2: 25 mmol/L (ref 22–32)
Calcium: 9.5 mg/dL (ref 8.9–10.3)
Chloride: 103 mmol/L (ref 98–111)
Creatinine, Ser: 0.89 mg/dL (ref 0.44–1.00)
GFR calc Af Amer: >60 mL/min (ref >60)
GFR calc non Af Amer: >60 mL/min (ref >60)
Glucose, Bld: 90 mg/dL (ref 70–99)
Potassium: 4.5 mmol/L (ref 3.5–5.1)
Sodium: 139 mmol/L (ref 135–145)
Total Bilirubin: 0.4 mg/dL (ref 0.3–1.2)
Total Protein: 7.5 g/dL (ref 6.5–8.1)

## 2019-06-11 LAB — URINALYSIS, ROUTINE W REFLEX MICROSCOPIC
Bilirubin Urine: NEGATIVE
Glucose, UA: NEGATIVE mg/dL
Hgb urine dipstick: NEGATIVE
Ketones, ur: NEGATIVE mg/dL
Leukocytes,Ua: NEGATIVE
Nitrite: NEGATIVE
Protein, ur: NEGATIVE mg/dL
Specific Gravity, Urine: 1.017 (ref 1.005–1.030)
pH: 6 (ref 5.0–8.0)

## 2019-06-11 LAB — CBC
HCT: 43.2 % (ref 36.0–46.0)
Hemoglobin: 14.5 g/dL (ref 12.0–15.0)
MCH: 27.7 pg (ref 26.0–34.0)
MCHC: 33.6 g/dL (ref 30.0–36.0)
MCV: 82.4 fL (ref 80.0–100.0)
Platelets: 313 10*3/uL (ref 150–400)
RBC: 5.24 MIL/uL — ABNORMAL HIGH (ref 3.87–5.11)
RDW: 15.2 % (ref 11.5–15.5)
WBC: 10.4 10*3/uL (ref 4.0–10.5)
nRBC: 0 % (ref 0.0–0.2)

## 2019-06-11 MED ORDER — SODIUM CHLORIDE 0.9 % IV BOLUS
1000.0000 mL | Freq: Once | INTRAVENOUS | Status: AC
  Administered 2019-06-12: 1000 mL via INTRAVENOUS

## 2019-06-11 NOTE — ED Provider Notes (Addendum)
[facility]-EMERGENCY DEPT Provider Note   CSN: [id number] Arrival date & time: 06/11/19  1504     History Chief Complaint  Patient presents with  . Abdominal Pain    Lauren Cantu is a 64 y.o. female.  HPI     Monday after dinner felt nauseated then started vomiting Sharp pain right side under rib cage and epigastrium, shooting pain, sharp pain, felt like contractions through RUQ Lower back pain Taking mylanta and not helping  Constant pain since Monday, every time tried to eat since Monday would feel sick, made the pain severe after eating  Pain 8/10  No diarrhea since Monday (had 4 times)  No fever Hot flashes No urinary symptoms  No known sick contacts    Past Medical History:  Diagnosis Date  . Essential hypertension, malignant   . Extrinsic asthma, unspecified   . Hyperlipidemia   . Nonspecific elevation of levels of transaminase or lactic acid dehydrogenase (LDH)   . Obesity, unspecified   . Pain in joint, site unspecified   . Type II or unspecified type diabetes mellitus without mention of complication, not stated as uncontrolled     There are no problems to display for this patient.   History reviewed. No pertinent surgical history.   OB History    Gravida  2   Para  1   Term      Preterm      AB  1   Living        SAB  1   TAB      Ectopic      Multiple      Live Births              No family history on file.  Social History   Tobacco Use  . Smoking status: Current Every Day Smoker  Substance Use Topics  . Alcohol use: No  . Drug use: No    Home Medications Prior to Admission medications   Medication Sig Start Date End Date Taking? Authorizing Provider  Albuterol Sulfate (PROAIR HFA IN) Inhale 90 mcg into the lungs as needed. 8.5 gm     [provider]  allopurinol (ZYLOPRIM) 300 MG tablet Take 300 mg by mouth daily.      [provider]  AMLODIPINE BESYLATE PO Take  10 mg by mouth daily.      [provider]  BENAZEPRIL HCL PO Take 40 mg by mouth daily. 40 mg tab myl     [provider]  Calcium Carbonate-Vitamin D (CALTRATE 600+D PO) Take 2 tablets by mouth daily.      [provider]  CALCIUM-MAGNESIUM-ZINC PO Take by mouth daily.      [provider]  celecoxib (CELEBREX) 200 MG capsule Take 200 mg by mouth daily. X 10 days     [provider]  Fluticasone Propionate  HFA (FLOVENT HFA IN) Inhale 100 mg into the lungs 2 (two) times daily.      [provider]  furosemide (LASIX) 20 MG tablet Take 20 mg by mouth daily.      [provider]  gabapentin (NEURONTIN) 100 MG capsule Take 100 mg by mouth at bedtime.    [provider]  GLUCOSAMINE PO Take by mouth. OTC     [provider]  lansoprazole (PREVACID) 30 MG capsule Take 30 mg by mouth daily. Lansoprazole DR     [provider]  LOVASTATIN PO Take 20 mg  by mouth daily.     [provider]  METFORMIN HCL PO Take 500 mg by mouth daily.      [provider]  montelukast (SINGULAIR) 10 MG tablet Take 10 mg by mouth daily.      [provider]  Multiple Vitamins-Minerals (CENTRUM SILVER PO) Take by mouth daily.      [provider]  POTASSIUM CHLORIDE PO Take 10 mg by mouth 2 (two) times daily. MEq SR    [provider]    Allergies    Colchicine, Sulfa antibiotics, and Celebrex [celecoxib]  Review of Systems   Review of Systems  Constitutional: Negative for fever.  Eyes: Negative for visual disturbance.  Respiratory: Negative for cough and shortness of breath (when pain severe).   Cardiovascular: Negative for chest pain (when pain radiates).  Gastrointestinal: Positive for abdominal pain, diarrhea, nausea and vomiting.  Musculoskeletal: Positive for back pain.  Skin: Negative for rash.  Neurological: Positive for light-headedness. Negative for syncope.     Physical Exam Updated Vital Signs BP 108/66   Pulse 67   Temp 98.9 F (37.2 C) (Oral)   Resp 15   Ht 5\' 2"  (1.575 m)   Wt 91 kg   SpO2 100%   BMI 36.71 kg/m   Physical Exam Vitals and nursing note reviewed.  Constitutional:      General: She is not in acute distress.    Appearance: She is well-developed. She is not diaphoretic.  HENT:     Head: Normocephalic and atraumatic.  Eyes:     Conjunctiva/sclera: Conjunctivae normal.  Cardiovascular:     Rate and Rhythm: Normal rate and regular rhythm.     Heart sounds: Normal heart sounds. No murmur. No friction rub. No gallop.   Pulmonary:     Effort: Pulmonary effort is normal. No respiratory distress.     Breath sounds: Normal breath sounds. No wheezing or rales.  Abdominal:     General: There is no distension.     Palpations: Abdomen is soft.     Tenderness: There is abdominal tenderness in the epigastric area. There is guarding. There is no right CVA tenderness or left CVA tenderness. Positive signs include Murphy's sign. Negative signs include McBurney's sign.  Musculoskeletal:        General: No tenderness.     Cervical back: Normal range of motion.  Skin:    General: Skin is warm and dry.     Findings: No erythema or rash.  Neurological:     Mental Status: She is alert and oriented to person, place, and time.     ED Results / Procedures / Treatments   Labs (all labs ordered are listed, but only abnormal results are displayed) Labs Reviewed  COMPREHENSIVE METABOLIC PANEL - Abnormal; Notable for the following components:      Result Value   AST 51 (*)    All other components within normal limits  CBC - Abnormal; Notable for the following components:   RBC 5.24 (*)    All other components within normal limits  LIPASE, BLOOD  URINALYSIS, ROUTINE W REFLEX MICROSCOPIC    EKG None  Radiology Abdomen Limited RUQ  Result Date: 06/12/2019 CLINICAL DATA:  Abdominal pain for 3 days. EXAM: ULTRASOUND  ABDOMEN LIMITED RIGHT UPPER QUADRANT COMPARISON:  Abdominal ultrasound 03/11/2012 FINDINGS: Gallbladder: Only minimally distended, likely due to recent p.o. intake. No gallstones or gallbladder wall thickening. No sonographic Murphy sign noted by sonographer. Common bile duct: Diameter: 2-3  mm, normal. Liver: Diffusely increased hepatic echogenicity. Areas of focal fatty sparing adjacent the gallbladder fossa. Liver parenchyma is difficult to penetrate. No discrete focal lesion. Portal vein is patent on color Doppler imaging with normal direction of blood flow towards the liver. Other: No right upper quadrant ascites. IMPRESSION: 1. Partially distended gallbladder likely due to recent p.o. intake. No gallstones or sonographic findings of acute cholecystitis. No biliary dilatation. 2. Hepatic steatosis with focal fatty sparing adjacent the gallbladder fossa. Electronically Signed   By: Keith Rake M.D.   On: 06/12/2019 00:27    Procedures Procedures (including critical care time)  Medications Ordered in ED Medications  morphine 4 MG/ML injection 4 mg (has no administration in time range)  sodium chloride 0.9 % bolus 1,000 mL (1,000 mLs Intravenous New Bag/Given 06/12/19 0001)    ED Course  I have reviewed the triage vital signs and the nursing notes.  Pertinent labs & imaging results that were available during my care of the patient were reviewed by me and considered in my medical decision making (see chart for details).    MDM Rules/Calculators/A&P                       64yo female with history of hypertension, asthma, hyperlipidemia, DM, presents with concern for right upper quadrant abdominal pain. DDx includes appendicitis, pancreatitis, cholecystitis, pyelonephritis, nephrolithiasis, diverticulitis, SBO, viral etiology/gastritis. No pelvic pain, doubt PID, ovarian torsion, ectopic pregnancy, and tuboovarian abscess.   No sign of pancreatitis on labs. No sign of pyelonephritis. RUQ US  shows no sign of gallstones or cholecystitis.  NWill order CT abdomen/pelvis for further evaluation, possible obstruction, nephrolithiasis or colitis. Signed out to Dr. Florina Ou with CT Pending.    Final Clinical Impression(s) / ED Diagnoses Final diagnoses:  RUQ abdominal pain    Rx / DC Orders ED Discharge Orders    None       Gareth Morgan, MD 06/12/19 0932    Gareth Morgan, MD 06/12/19 0104

## 2019-06-11 NOTE — ED Notes (Signed)
Pt given cup for UA.

## 2019-06-11 NOTE — ED Triage Notes (Signed)
Patient c/o abdominal pain with N/V/D x2 days. States pain worsens with eating.

## 2019-06-12 ENCOUNTER — Other Ambulatory Visit: Payer: Self-pay

## 2019-06-12 ENCOUNTER — Emergency Department (HOSPITAL_COMMUNITY): Payer: Medicare Other

## 2019-06-12 DIAGNOSIS — R1011 Right upper quadrant pain: Secondary | ICD-10-CM | POA: Diagnosis not present

## 2019-06-12 MED ORDER — IOHEXOL 300 MG/ML  SOLN
100.0000 mL | Freq: Once | INTRAMUSCULAR | Status: AC | PRN
  Administered 2019-06-12: 100 mL via INTRAVENOUS

## 2019-06-12 MED ORDER — SODIUM CHLORIDE (PF) 0.9 % IJ SOLN
INTRAMUSCULAR | Status: AC
  Filled 2019-06-12: qty 50

## 2019-06-12 MED ORDER — ONDANSETRON HCL 4 MG/2ML IJ SOLN
4.0000 mg | Freq: Once | INTRAMUSCULAR | Status: AC
  Administered 2019-06-12: 4 mg via INTRAVENOUS
  Filled 2019-06-12: qty 2

## 2019-06-12 MED ORDER — DIPHENHYDRAMINE HCL 50 MG/ML IJ SOLN
25.0000 mg | Freq: Once | INTRAMUSCULAR | Status: AC
  Administered 2019-06-12: 25 mg via INTRAVENOUS
  Filled 2019-06-12: qty 1

## 2019-06-12 MED ORDER — PANTOPRAZOLE SODIUM 40 MG IV SOLR
40.0000 mg | Freq: Once | INTRAVENOUS | Status: AC
  Administered 2019-06-12: 40 mg via INTRAVENOUS
  Filled 2019-06-12: qty 40

## 2019-06-12 MED ORDER — MORPHINE SULFATE (PF) 4 MG/ML IV SOLN
4.0000 mg | Freq: Once | INTRAVENOUS | Status: AC
  Administered 2019-06-12: 4 mg via INTRAVENOUS
  Filled 2019-06-12: qty 1

## 2019-06-12 NOTE — ED Notes (Signed)
Patient reported Hives and itching post zofran admin, [place],MD contacted for  Benadryl

## 2019-06-12 NOTE — ED Provider Notes (Signed)
Nursing notes and vitals signs, including pulse oximetry, reviewed.  Summary of this visit's results, reviewed by myself:  EKG:  EKG Interpretation  Date/Time:  Thursday Jun 12 2019 03:03:42 EDT Ventricular Rate:  75 PR Interval:    QRS Duration: 93 QT Interval:  389 QTC Calculation: 435 R Axis:   65 Text Interpretation: Sinus rhythm Normal ECG No significant change was found Confirmed by Paula Libra ([id number]) on 06/12/2019 3:19:35 AM       Labs:  Results for orders placed or performed during the hospital encounter of 06/11/19 (from the past 24 hour(s))  Lipase, blood     Status: None   Collection Time: 06/11/19  6:01 PM  Result Value Ref Range   Lipase 30 11 - 51 U/L  Comprehensive metabolic panel     Status: Abnormal   Collection Time: 06/11/19  6:01 PM  Result Value Ref Range   Sodium 139 135 - 145 mmol/L   Potassium 4.5 3.5 - 5.1 mmol/L   Chloride 103 98 - 111 mmol/L   CO2 25 22 - 32 mmol/L   Glucose, Bld 90 70 - 99 mg/dL   BUN 12 8 - 23 mg/dL   Creatinine, Ser 6.96 0.44 - 1.00 mg/dL   Calcium 9.5 8.9 - 78.9 mg/dL   Total Protein 7.5 6.5 - 8.1 g/dL   Albumin 3.9 3.5 - 5.0 g/dL   AST 51 (H) 15 - 41 U/L   ALT 31 0 - 44 U/L   Alkaline Phosphatase 103 38 - 126 U/L   Total Bilirubin 0.4 0.3 - 1.2 mg/dL   GFR calc non Af Amer >60 >60 mL/min   GFR calc Af Amer >60 >60 mL/min   Anion gap 11 5 - 15  CBC     Status: Abnormal   Collection Time: 06/11/19  6:01 PM  Result Value Ref Range   WBC 10.4 4.0 - 10.5 K/uL   RBC 5.24 (H) 3.87 - 5.11 MIL/uL   Hemoglobin 14.5 12.0 - 15.0 g/dL   HCT 38.1 01.7 - 51.0 %   MCV 82.4 80.0 - 100.0 fL   MCH 27.7 26.0 - 34.0 pg   MCHC 33.6 30.0 - 36.0 g/dL   RDW 25.8 52.7 - 78.2 %   Platelets 313 150 - 400 K/uL   nRBC 0.0 0.0 - 0.2 %  Urinalysis, Routine w reflex microscopic     Status: None   Collection Time: 06/11/19  6:11 PM  Result Value Ref Range   Color, Urine YELLOW YELLOW   APPearance CLEAR CLEAR   Specific Gravity, Urine  1.017 1.005 - 1.030   pH 6.0 5.0 - 8.0   Glucose, UA NEGATIVE NEGATIVE mg/dL   Hgb urine dipstick NEGATIVE NEGATIVE   Bilirubin Urine NEGATIVE NEGATIVE   Ketones, ur NEGATIVE NEGATIVE mg/dL   Protein, ur NEGATIVE NEGATIVE mg/dL   Nitrite NEGATIVE NEGATIVE   Leukocytes,Ua NEGATIVE NEGATIVE    Imaging Studies: CT ABDOMEN PELVIS W CONTRAST  Result Date: 06/12/2019 CLINICAL DATA:  Abdominal pain. Nausea, vomiting, diarrhea for 2 days. Evaluate for obstruction versus nephrolithiasis. EXAM: CT ABDOMEN AND PELVIS WITH CONTRAST TECHNIQUE: Multidetector CT imaging of the abdomen and pelvis was performed using the standard protocol following bolus administration of intravenous contrast. CONTRAST:  OMNIPAQUE IOHEXOL 300 MG/ML  SOLN COMPARISON:  Right upper quadrant ultrasound earlier today. FINDINGS: Lower chest: Linear atelectasis in the lingula and left lower lobe. Hepatobiliary: Prominent size liver spanning 17 cm cranial caudal. Diffusely decreased hepatic density consistent with  steatosis. Mild focal fatty sparing adjacent to the gallbladder fossa. No focal hepatic lesion. Gallbladder physiologically distended, no calcified stone. No biliary dilatation. Pancreas: No ductal dilatation or inflammation. Spleen: Normal in size without focal abnormality. Adrenals/Urinary Tract: There is bilateral adrenal thickening without dominant nodule. No hydronephrosis or perinephric edema. Homogeneous renal enhancement. There are bilateral parapelvic cysts. Symmetric excretion on delayed phase imaging. No visualized urolithiasis. Urinary bladder is physiologically distended. There is no bladder wall thickening. No bladder stone. Stomach/Bowel: The stomach is unremarkable. No bowel obstruction or inflammatory change. Normal appendix. Stool and air distended proximal stomach without colonic wall thickening. Small volume of stool in the more distal colon. No pericolonic edema. No colonic inflammation. Vascular/Lymphatic:  Moderate to advanced aortic atherosclerosis. No aortic aneurysm. The portal vein is patent. Few prominent periportal nodes are likely reactive. No enlarged lymph nodes in the abdomen or pelvis. Reproductive: Uterus is not well-defined, may be surgically absent or atrophic. Left ovary tentatively but not definitively visualized. Trace free fluid in the left adnexa. Other: Trace free fluid in the left adnexa. No free air. No intra-abdominal abscess. Tiny fat containing umbilical hernia. Musculoskeletal: There are no acute or suspicious osseous abnormalities. Degenerative change in the lower lumbar spine. IMPRESSION: 1. No acute findings in the abdomen/pelvis. 2. Prominent size liver with hepatic steatosis. 3. Parapelvic cysts in both kidneys. 4.  Aortic Atherosclerosis (ICD10-I70.0). Electronically Signed   By: Keith Rake M.D.   On: 06/12/2019 03:05   US Abdomen Limited RUQ  Result Date: 06/12/2019 CLINICAL DATA:  Abdominal pain for 3 days. EXAM: ULTRASOUND ABDOMEN LIMITED RIGHT UPPER QUADRANT COMPARISON:  Abdominal ultrasound 03/11/2012 FINDINGS: Gallbladder: Only minimally distended, likely due to recent p.o. intake. No gallstones or gallbladder wall thickening. No sonographic Murphy sign noted by sonographer. Common bile duct: Diameter: 2-3 mm, normal. Liver: Diffusely increased hepatic echogenicity. Areas of focal fatty sparing adjacent the gallbladder fossa. Liver parenchyma is difficult to penetrate. No discrete focal lesion. Portal vein is patent on color Doppler imaging with normal direction of blood flow towards the liver. Other: No right upper quadrant ascites. IMPRESSION: 1. Partially distended gallbladder likely due to recent p.o. intake. No gallstones or sonographic findings of acute cholecystitis. No biliary dilatation. 2. Hepatic steatosis with focal fatty sparing adjacent the gallbladder fossa. Electronically Signed   By: Keith Rake M.D.   On: 06/12/2019 00:27   3:26 AM Patient's  abdomen soft, nontender.  Patient denies pain at the present time.  The hives she got after receiving Zofran have resolved with IV Benadryl.  Zofran now listed in Epic as a drug allergy.  She is already on a PPI and Mylanta.  She had been getting good relief with Mylanta until the episode that brought her to the ED this visit.  She was advised of her reassuring lab work and imaging studies.   Uyen Eichholz, Jenny Reichmann, MD 06/12/19 [phone]

## 2019-06-12 NOTE — ED Notes (Signed)
US in with patient.

## 2019-06-14 ENCOUNTER — Emergency Department (HOSPITAL_COMMUNITY): Payer: Medicare Other

## 2019-06-14 ENCOUNTER — Other Ambulatory Visit: Payer: Self-pay

## 2019-06-14 ENCOUNTER — Emergency Department (HOSPITAL_COMMUNITY)
Admission: EM | Admit: 2019-06-14 | Discharge: 2019-06-14 | Disposition: A | Discharge status: Home / Self Care | Payer: Medicare Other | Attending: Emergency Medicine | Admitting: Emergency Medicine

## 2019-06-14 ENCOUNTER — Encounter (HOSPITAL_COMMUNITY): Payer: Self-pay | Admitting: Emergency Medicine

## 2019-06-14 DIAGNOSIS — E119 Type 2 diabetes mellitus without complications: Secondary | ICD-10-CM | POA: Diagnosis not present

## 2019-06-14 DIAGNOSIS — R112 Nausea with vomiting, unspecified: Secondary | ICD-10-CM | POA: Insufficient documentation

## 2019-06-14 DIAGNOSIS — F17200 Nicotine dependence, unspecified, uncomplicated: Secondary | ICD-10-CM | POA: Diagnosis not present

## 2019-06-14 DIAGNOSIS — I10 Essential (primary) hypertension: Secondary | ICD-10-CM | POA: Diagnosis not present

## 2019-06-14 DIAGNOSIS — Z7984 Long term (current) use of oral hypoglycemic drugs: Secondary | ICD-10-CM | POA: Insufficient documentation

## 2019-06-14 DIAGNOSIS — Z79899 Other long term (current) drug therapy: Secondary | ICD-10-CM | POA: Diagnosis not present

## 2019-06-14 DIAGNOSIS — R1011 Right upper quadrant pain: Secondary | ICD-10-CM | POA: Diagnosis present

## 2019-06-14 DIAGNOSIS — R1013 Epigastric pain: Secondary | ICD-10-CM | POA: Diagnosis not present

## 2019-06-14 LAB — COMPREHENSIVE METABOLIC PANEL
ALT: 26 U/L (ref 0–44)
AST: 34 U/L (ref 15–41)
Albumin: 3.8 g/dL (ref 3.5–5.0)
Alkaline Phosphatase: 106 U/L (ref 38–126)
Anion gap: 9 (ref 5–15)
BUN: 16 mg/dL (ref 8–23)
CO2: 24 mmol/L (ref 22–32)
Calcium: 9.7 mg/dL (ref 8.9–10.3)
Chloride: 105 mmol/L (ref 98–111)
Creatinine, Ser: 0.95 mg/dL (ref 0.44–1.00)
GFR calc Af Amer: >60 mL/min (ref >60)
GFR calc non Af Amer: >60 mL/min (ref >60)
Glucose, Bld: 116 mg/dL — ABNORMAL HIGH (ref 70–99)
Potassium: 3.9 mmol/L (ref 3.5–5.1)
Sodium: 138 mmol/L (ref 135–145)
Total Bilirubin: 0.3 mg/dL (ref 0.3–1.2)
Total Protein: 7.5 g/dL (ref 6.5–8.1)

## 2019-06-14 LAB — CBC WITH DIFFERENTIAL/PLATELET
Abs Immature Granulocytes: 0.02 10*3/uL (ref 0.00–0.07)
Basophils Absolute: 0 10*3/uL (ref 0.0–0.1)
Basophils Relative: 1 %
Eosinophils Absolute: 0.2 10*3/uL (ref 0.0–0.5)
Eosinophils Relative: 2 %
HCT: 37.6 % (ref 36.0–46.0)
Hemoglobin: 13 g/dL (ref 12.0–15.0)
Immature Granulocytes: 0 %
Lymphocytes Relative: 34 %
Lymphs Abs: 2.8 10*3/uL (ref 0.7–4.0)
MCH: 28.3 pg (ref 26.0–34.0)
MCHC: 34.6 g/dL (ref 30.0–36.0)
MCV: 81.9 fL (ref 80.0–100.0)
Monocytes Absolute: 0.6 10*3/uL (ref 0.1–1.0)
Monocytes Relative: 7 %
Neutro Abs: 4.7 10*3/uL (ref 1.7–7.7)
Neutrophils Relative %: 56 %
Platelets: 290 10*3/uL (ref 150–400)
RBC: 4.59 MIL/uL (ref 3.87–5.11)
RDW: 15.2 % (ref 11.5–15.5)
WBC: 8.3 10*3/uL (ref 4.0–10.5)
nRBC: 0 % (ref 0.0–0.2)

## 2019-06-14 LAB — LIPASE, BLOOD: Lipase: 54 U/L — ABNORMAL HIGH (ref 11–51)

## 2019-06-14 MED ORDER — IOHEXOL 300 MG/ML  SOLN
100.0000 mL | Freq: Once | INTRAMUSCULAR | Status: AC | PRN
  Administered 2019-06-14: 100 mL via INTRAVENOUS

## 2019-06-14 MED ORDER — FAMOTIDINE IN NACL 20-0.9 MG/50ML-% IV SOLN
20.0000 mg | Freq: Once | INTRAVENOUS | Status: AC
  Administered 2019-06-14: 20 mg via INTRAVENOUS
  Filled 2019-06-14: qty 50

## 2019-06-14 MED ORDER — OMEPRAZOLE 20 MG PO CPDR: 20.0000 mg | DELAYED_RELEASE_CAPSULE | Freq: Two times a day (BID) | ORAL | 1 refills | Status: DC

## 2019-06-14 MED ORDER — PROCHLORPERAZINE EDISYLATE 10 MG/2ML IJ SOLN
10.0000 mg | Freq: Once | INTRAMUSCULAR | Status: AC
  Administered 2019-06-14: 10 mg via INTRAVENOUS
  Filled 2019-06-14: qty 2

## 2019-06-14 MED ORDER — SODIUM CHLORIDE 0.9 % IV BOLUS
500.0000 mL | Freq: Once | INTRAVENOUS | Status: AC
  Administered 2019-06-14: 500 mL via INTRAVENOUS

## 2019-06-14 MED ORDER — SODIUM CHLORIDE (PF) 0.9 % IJ SOLN
INTRAMUSCULAR | Status: AC
  Filled 2019-06-14: qty 50

## 2019-06-14 NOTE — ED Triage Notes (Signed)
Pt c/o abdominal pain that starts on her RUQ abd that radiates to mid epigastric area abd onset x 1 week  Seen here  At [facility] Wednesday past for same

## 2019-06-14 NOTE — ED Provider Notes (Signed)
[facility]-EMERGENCY DEPT Provider Note   CSN: [id number] Arrival date & time: 06/14/19  3818     History Chief Complaint  Patient presents with  . Abdominal Pain    Lauren Cantu is a 64 y.o. female with history of type 2 diabetes, obesity, hyperlipidemia, presented prescribed abdominal pain.  Patient reports gradual onset of abdominal pain about 2 to 3 weeks ago.  She describes this as a sharp pain in her right upper quadrant that seems to radiate around and focused in her epigastrium.  She said the pain intensified significantly about 4 days ago.  She states that at that time that she woke up from sleep feeling very nauseated and vomited several times, and also had about 5 loose bowel movements.  She developed persistent pain again in her epigastrium right upper quadrant.  She says the pain has not gone away since.  She has only been able to keep down some soup or broth.  She is not able to anything solid.  She feels nauseated but has not had any further vomiting.   Her last bowel movement was 5 days ago.  She continues to have frequent belching with some relief of her pain after belching.  She has been taking mylanta at home with some mild improvement of her pain.  She came to emergency department 2 days ago on 5/27 and had a RUQ ultrasound performed and  CT abdomen pelvis, with partially distended GB but no gallstones or sonographic findings of cholecystitis, and no biliary dilitation.  CT showed prominent liver with steatosis, no hepatic lesions, distended GB with no biliary dilatation, unremarkable bowels, normal appendix, air-distended proximal stomach, and few reactive periportal aortic nodes.  On 5/26, UA was unremarkable, CBC and BMP were wnl, lipase normal.  She was given nausea and GI meds and discharged.  She returns to the ED complaining of worsening pain.    She does report having had an EGD and colonoscopy within the past 10 years but states "everything was  normal."  She has a hx of reflux and takes Prevacid for that chronically - ran out of this a few days ago.  PSHx:  Hysterectomy, C-section Allergies: Zofran (hives),  Morphine (nausea)  HPI     Past Medical History:  Diagnosis Date  . Essential hypertension, malignant   . Extrinsic asthma, unspecified   . Hyperlipidemia   . Nonspecific elevation of levels of transaminase or lactic acid dehydrogenase (LDH)   . Obesity, unspecified   . Pain in joint, site unspecified   . Type II or unspecified type diabetes mellitus without mention of complication, not stated as uncontrolled     There are no problems to display for this patient.   History reviewed. No pertinent surgical history.   OB History    Gravida  2   Para  1   Term      Preterm      AB  1   Living        SAB  1   TAB      Ectopic      Multiple      Live Births              History reviewed. No pertinent family history.  Social History   Tobacco Use  . Smoking status: Current Every Day Smoker  . Smokeless tobacco: Never Used  Substance Use Topics  . Alcohol use: No  . Drug use: No    Home  Medications Prior to Admission medications   Medication Sig Start Date End Date Taking? Authorizing Provider  allopurinol (ZYLOPRIM) 300 MG tablet Take 300 mg by mouth daily.     Yes [provider]  alum & mag hydroxide-simeth (MAALOX/MYLANTA) 200-200-20 MG/5ML suspension Take 30 mLs by mouth every 6 (six) hours as needed for indigestion or heartburn.   Yes [provider]  amLODipine (NORVASC) 10 MG tablet Take 10 mg by mouth daily. 05/27/19  Yes [provider]  benazepril (LOTENSIN) 40 MG tablet Take 40 mg by mouth daily. 05/27/19  Yes [provider]  Calcium Carbonate-Vitamin D (CALTRATE 600+D PO) Take 2 tablets by mouth daily.     Yes [provider]  CALCIUM-MAGNESIUM-ZINC PO Take by mouth daily.     Yes [provider]  furosemide (LASIX) 20  MG tablet Take 20 mg by mouth daily.     Yes [provider]  gabapentin (NEURONTIN) 100 MG capsule Take 100 mg by mouth at bedtime.   Yes [provider]  GLUCOSAMINE PO Take by mouth. OTC    Yes [provider]  lansoprazole (PREVACID) 30 MG capsule Take 30 mg by mouth daily. Lansoprazole DR    Yes [provider]  lovastatin (MEVACOR) 20 MG tablet Take 20 mg by mouth daily. 05/27/19  Yes [provider]  meloxicam (MOBIC) 7.5 MG tablet Take 7.5 mg by mouth daily. 05/27/19  Yes [provider]  metFORMIN (GLUCOPHAGE-XR) 500 MG 24 hr tablet Take 500 mg by mouth daily. 05/27/19  Yes [provider]  montelukast (SINGULAIR) 10 MG tablet Take 10 mg by mouth daily.     Yes [provider]  Multiple Vitamins-Minerals (CENTRUM SILVER PO) Take by mouth daily.     Yes [provider]  potassium chloride (KLOR-CON) 10 MEQ tablet Take 10 mEq by mouth 2 (two) times daily. 05/27/19  Yes [provider]  VENTOLIN HFA 108 (90 Base) MCG/ACT inhaler Inhale 1 puff into the lungs 4 (four) times daily as needed for cough, wheezing or shortness of breath. 06/09/19  Yes [provider]  omeprazole (PRILOSEC) 20 MG capsule Take 1 capsule (20 mg total) by mouth 2 (two) times daily before a meal. 06/14/19 08/13/19  Yamel Bale, Kermit Balo, MD    Allergies    Colchicine, Sulfa antibiotics, Celebrex [celecoxib], and Zofran [ondansetron]  Review of Systems   Review of Systems  Constitutional: Negative for chills and fever.  HENT: Negative for ear pain and sore throat.   Eyes: Negative for pain and visual disturbance.  Respiratory: Negative for cough and shortness of breath.   Cardiovascular: Negative for chest pain and palpitations.  Gastrointestinal: Positive for abdominal pain, constipation, nausea and vomiting.  Genitourinary: Negative for dysuria and hematuria.  Musculoskeletal: Negative for arthralgias and back pain.  Skin:  Negative for color change and rash.  Neurological: Negative for seizures and syncope.  All other systems reviewed and are negative.   Physical Exam Updated Vital Signs BP 128/76 (BP Location: Left Arm)   Pulse 84   Temp 98.1 F (36.7 C) (Oral)   Resp 16   Ht 5\' 2"  (1.575 m)   Wt 93.9 kg   SpO2 98%   BMI 37.86 kg/m   Physical Exam Vitals and nursing note reviewed.  Constitutional:      General: She is not in acute distress.    Appearance: She is well-developed.  HENT:     Head: Normocephalic and atraumatic.  Eyes:  Conjunctiva/sclera: Conjunctivae normal.  Cardiovascular:     Rate and Rhythm: Normal rate and regular rhythm.     Heart sounds: No murmur.  Pulmonary:     Effort: Pulmonary effort is normal. No respiratory distress.     Breath sounds: Normal breath sounds.  Abdominal:     General: There is distension.     Palpations: Abdomen is soft.     Tenderness: There is abdominal tenderness in the epigastric area. Negative signs include Murphy's sign and McBurney's sign.  Musculoskeletal:     Cervical back: Neck supple.  Skin:    General: Skin is warm and dry.  Neurological:     General: No focal deficit present.     Mental Status: She is alert and oriented to person, place, and time.  Psychiatric:        Mood and Affect: Mood normal.        Behavior: Behavior normal.     ED Results / Procedures / Treatments   Labs (all labs ordered are listed, but only abnormal results are displayed) Labs Reviewed  COMPREHENSIVE METABOLIC PANEL - Abnormal; Notable for the following components:      Result Value   Glucose, Bld 116 (*)    All other components within normal limits  LIPASE, BLOOD - Abnormal; Notable for the following components:   Lipase 54 (*)    All other components within normal limits  CBC WITH DIFFERENTIAL/PLATELET    EKG None  Radiology CT ABDOMEN PELVIS W CONTRAST  Result Date: 06/14/2019 CLINICAL DATA:  Abdominal distention and belching. No  bowel movement for 5 days. Concern for bowel obstruction. EXAM: CT ABDOMEN AND PELVIS WITH CONTRAST TECHNIQUE: Multidetector CT imaging of the abdomen and pelvis was performed using the standard protocol following bolus administration of intravenous contrast. CONTRAST:  124mL OMNIPAQUE IOHEXOL 300 MG/ML  SOLN COMPARISON:  06/12/2019 CT abdomen/pelvis. FINDINGS: Lower chest: No significant pulmonary nodules or acute consolidative airspace disease. Hepatobiliary: Diffuse hepatic steatosis. No definite liver surface irregularity. No liver masses. Normal gallbladder with no radiopaque cholelithiasis. No biliary ductal dilatation. Pancreas: Normal, with no mass or duct dilation. Spleen: Normal size. No mass. Adrenals/Urinary Tract: Right adrenal 1.6 cm nodule with density 49 HU, stable size since 06/12/2019 CT. Left adrenal 2.0 cm nodule with density 56 HU, stable size since 06/12/2019 CT. No hydronephrosis. Simple bilateral renal cysts, largest 3.7 cm in the interpolar right kidney. Normal bladder. Stomach/Bowel: Normal non-distended stomach. Normal caliber small bowel with no small bowel wall thickening. Normal appendix. Normal large bowel with no diverticulosis, large bowel wall thickening or pericolonic fat stranding. Vascular/Lymphatic: Atherosclerotic nonaneurysmal abdominal aorta. Patent portal, splenic, hepatic and renal veins. No pathologically enlarged lymph nodes in the abdomen or pelvis. Reproductive: Status post hysterectomy, with no abnormal findings at the vaginal cuff. No adnexal mass. Other: No pneumoperitoneum. Trace simple pelvic free fluid, unchanged, nonspecific. No focal fluid collection. Musculoskeletal: No aggressive appearing focal osseous lesions. Moderate thoracolumbar spondylosis. IMPRESSION: 1. No acute abnormality. No evidence of bowel obstruction or acute bowel inflammation. Normal appendix. 2. Diffuse hepatic steatosis. 3. Stable bilateral adrenal nodules, indeterminate. In the absence of  a history of malignancy, follow-up adrenal protocol CT abdomen without and with IV contrast recommended in 12 months. This recommendation follows ACR consensus guidelines: Management of Incidental Adrenal Masses: A White Paper of the ACR Incidental Findings Committee. J Am Coll Radiol 2017;14:1038-1044. 4. Aortic Atherosclerosis (ICD10-I70.0). Electronically Signed   By: Ilona Sorrel M.D.   On: 06/14/2019 09:26  Procedures Procedures (including critical care time)  Medications Ordered in ED Medications  sodium chloride (PF) 0.9 % injection (has no administration in time range)  famotidine (PEPCID) IVPB 20 mg premix (0 mg Intravenous Stopped 06/14/19 0830)  sodium chloride 0.9 % bolus 500 mL ( Intravenous Stopped 06/14/19 0838)  prochlorperazine (COMPAZINE) injection 10 mg (10 mg Intravenous Given 06/14/19 0751)  iohexol (OMNIPAQUE) 300 MG/ML solution 100 mL (100 mLs Intravenous Contrast Given 06/14/19 0855)    ED Course  I have reviewed the triage vital signs and the nursing notes.  Pertinent labs & imaging results that were available during my care of the patient were reviewed by me and considered in my medical decision making (see chart for details).   64 yo female here with epigastric abdominal pain DDx includes gastric ulcer vs gastroparesis vs pancreatitis vs ileus vs SBO vs biliary disease vs other  With abdominal distension and belching, no BM in several days, I felt a repeat CT scan would be warranted to look for signs of developing ileus.  Even if not seen on CT, I would advise continued softs and fluid diet at home.  No evidence of biliary disease on her bloodwork or prior RUQ ultrasound or CT scan - doubtful of choledocolithiasis with no biliary diltation noted on priors.  This is likely gastritis or gastric ulcer as she ran out of her ppi around the time this "worsened," and feels it is responding to mylanta at home.  If her workup is unremarkable she will need GI follow  up.  Clinical Course as of Jun 14 1718  Sat Jun 14, 2019  6294 IMPRESSION: 1. No acute abnormality. No evidence of bowel obstruction or acute bowel inflammation. Normal appendix. 2. Diffuse hepatic steatosis. 3. Stable bilateral adrenal nodules, indeterminate. In the absence of a history of malignancy, follow-up adrenal protocol CT abdomen without and with IV contrast recommended in 12 months. This recommendation follows ACR consensus guidelines: Management of Incidental Adrenal Masses: A White Paper of the ACR Incidental Findings Committee. J Am Coll Radiol 2017;14:1038-1044. 4. Aortic Atherosclerosis (ICD10-I70.0).   [MT]  1212 Her abdominal pain has improved.  I suspect this is likely gastric ulcer.  There is very minor elevation in her lipase, but no other clear risk factors for pancreatitis no signs of inflammation on her CT scan.  Likewise her bile duct is not dilated.  I doubt this is a retained stone.  I will represcribe her her PPI she did run out of this several days ago, will try omeprazole as her insurance was not cover lansoprazole. She should follow up with GI   [MT]    Clinical Course User Index [MT] Makala Fetterolf, Kermit Balo, MD    Final Clinical Impression(s) / ED Diagnoses Final diagnoses:  Epigastric pain    Rx / DC Orders ED Discharge Orders         Ordered    omeprazole (PRILOSEC) 20 MG capsule  2 times daily before meals     06/14/19 1215    Ambulatory referral to Gastroenterology    Comments: Suspect peptic ulcer, multiple ED visits for epigastric pain, responds to mylanta and pepcid   06/14/19 1217           Terald Sleeper, MD 06/14/19 1720

## 2019-06-14 NOTE — Discharge Instructions (Addendum)
You may have a stomach ulcer from the location of your pain.  This is not something we can definitively diagnosis in the ER with our testing.  I prescribed omeprazole for you, and I'd like you to follow up with a GI (gastroenterologist).  Your blood tests and CT scan was otherwise reassuring.  I do not see signs of gallbladder disease or infectious emergency in your stomach.    If you begin having severe vomiting, or your pain worsens and you cannot eat or drink, you should return to the ER immediately.

## 2019-06-17 ENCOUNTER — Encounter: Payer: Self-pay | Admitting: Gastroenterology

## 2019-08-06 ENCOUNTER — Encounter: Payer: Self-pay | Admitting: Gastroenterology

## 2019-08-06 ENCOUNTER — Ambulatory Visit (INDEPENDENT_AMBULATORY_CARE_PROVIDER_SITE_OTHER): Payer: Medicare Other | Admitting: Gastroenterology

## 2019-08-06 VITALS — BP 120/70 | HR 98 | Ht 62.0 in | Wt 202.0 lb

## 2019-08-06 DIAGNOSIS — R141 Gas pain: Secondary | ICD-10-CM

## 2019-08-06 DIAGNOSIS — R142 Eructation: Secondary | ICD-10-CM

## 2019-08-06 DIAGNOSIS — Z86010 Personal history of colonic polyps: Secondary | ICD-10-CM | POA: Diagnosis not present

## 2019-08-06 DIAGNOSIS — R143 Flatulence: Secondary | ICD-10-CM | POA: Diagnosis not present

## 2019-08-06 DIAGNOSIS — R109 Unspecified abdominal pain: Secondary | ICD-10-CM

## 2019-08-06 MED ORDER — SUPREP BOWEL PREP KIT 17.5-3.13-1.6 GM/177ML PO SOLN
1.0000 | ORAL | 0 refills | Status: DC
Start: 2019-08-06 — End: 2021-04-28

## 2019-08-06 MED ORDER — SUPREP BOWEL PREP KIT 17.5-3.13-1.6 GM/177ML PO SOLN
1.0000 | ORAL | 0 refills | Status: DC
Start: 2019-08-06 — End: 2019-08-06

## 2019-08-06 MED ORDER — OMEPRAZOLE 40 MG PO CPDR
40.0000 mg | DELAYED_RELEASE_CAPSULE | Freq: Two times a day (BID) | ORAL | 3 refills | Status: AC
Start: 2019-08-06 — End: ?

## 2019-08-06 NOTE — Patient Instructions (Addendum)
Increase omeprazole 40 mg twice daily  We have sent the following medications to your pharmacy for you to pick up at your convenience: Omeprazole, Suprep  If you are age 66 or older, your body mass index should be between 23-30. Your Body mass index is 36.95 kg/m. If this is out of the aforementioned range listed, please consider follow up with your Primary Care Provider.  If you are age 28 or younger, your body mass index should be between 19-25. Your Body mass index is 36.95 kg/m. If this is out of the aformentioned range listed, please consider follow up with your Primary Care Provider.   You have been scheduled for an endoscopy and colonoscopy. Please follow the written instructions given to you at your visit today. Please pick up your prep supplies at the pharmacy within the next 1-3 days. If you use inhalers (even only as needed), please bring them with you on the day of your procedure.   Thank you for choosing me and  Gastroenterology.  Dr.Primo Innis

## 2019-08-06 NOTE — Progress Notes (Signed)
Referring Provider: Gwenyth Bender, MD Primary Care Physician:  Gwenyth Bender, MD  Reason for Consultation:  Abdominal pain   IMPRESSION:  Abdominal pain occurring while off PPI therapy Longstanding history of gas despite PPI History of colon polyps    - 3 polyps removed >5 years ago (patient cannot remember doctor name, results noted in Epic)    - Lauren Cantu remembered being told to have surveillance colonoscopy in 5 years Father with pancreatic cancer in his 35s No family history of colon cancer or polyps  Recent abdominal pain likely due to esophagitis +/- gastritis given her dramatic improvement in symptoms. Given her concurrent gas, will plan EGD with biopsies. Increase omeprazole to 40 mg BID.   Surveillance colonoscopy recommended given her history of polyps. Will try to obtain her prior procedure and pathology reports.   PLAN: Increase omeprazole 40 mg BID  EGD and colonoscopy  Please see the "Patient Instructions" section for addition details about the plan.  I spent 45 minutes, including in depth chart review, independent review of results, face-to-face time with the patient, coordinating care, ordering studies and medications as appropriate, and documentation.   HPI: Lauren Cantu is a 64 y.o. female referred by the ED physicians. The history is obtained through the patient and review of her electronic health record. Lauren Cantu has type 2 diabetes, obesity, hyperlipidemia, arthritis, asthma. Retired from [employer] 3 years ago.  Completed the Covid vaccine in February 2021.   On some kind of acid blocking medication since her early 54s.   Prior EGD many years ago. Prior colonoscopy 5 years ago. Lauren Cantu cannot remember the physician. Lauren Cantu remembers having 3 polyps with a surveillance colonoscopy 5 years.   Seen in the ED multiple times in May for sharp, RUQ abdominal pain that radiated to the epigastrium with associated nausea and vomiting. Developed 7 days after stopping her PPI.  Symptoms have largely resolved since resuming PPI therapy.   Abdominal ultrasound 5/26 and CT abd/pelvis with contrast 5/27 and 5/29 showed a partially distended GB but no gallstones or sonographic findings of cholecystitis, fatty liver, and no biliary dilitation.  CBC and BMP were wnl, lipase normal.    Pain has completely resolved. Continues to have significant gas with eructation/flatus/bloating, especially if Lauren Cantu doesn't take her medications. Has a lifetime history of gas predominantly manifest as burping. Gas occurs 30-60 minutes after eating.  But, Lauren Cantu reports a lifetime history of gas.   No diarrhea, constipation, or change in bowel habits.   Father and sister with diverticulitis. No known family history of colon cancer or polyps. No family history of uterine/endometrial cancer, pancreatic cancer or gastric/stomach cancer.   Past Medical History:  Diagnosis Date  . Arthritis   . Essential hypertension, malignant   . Extrinsic asthma, unspecified   . GERD (gastroesophageal reflux disease)   . Hyperlipidemia   . Nonspecific elevation of levels of transaminase or lactic acid dehydrogenase (LDH)   . Obesity, unspecified   . Pain in joint, site unspecified   . Type II or unspecified type diabetes mellitus without mention of complication, not stated as uncontrolled     Past Surgical History:  Procedure Laterality Date  . ABDOMINAL HYSTERECTOMY      Current Outpatient Medications  Medication Sig Dispense Refill  . allopurinol (ZYLOPRIM) 300 MG tablet Take 300 mg by mouth daily.      Marland Kitchen alum & mag hydroxide-simeth (MAALOX/MYLANTA) 200-200-20 MG/5ML suspension Take 30 mLs by mouth every 6 (six)  hours as needed for indigestion or heartburn.    Marland Kitchen amLODipine (NORVASC) 10 MG tablet Take 10 mg by mouth daily.    . benazepril (LOTENSIN) 40 MG tablet Take 40 mg by mouth daily.    . Calcium Carbonate-Vitamin D (CALTRATE 600+D PO) Take 2 tablets by mouth daily.      Marland Kitchen CALCIUM-MAGNESIUM-ZINC  PO Take by mouth daily.      Marland Kitchen FLOVENT HFA 110 MCG/ACT inhaler SMARTSIG:1-2 Puff(s) By Mouth Twice Daily    . furosemide (LASIX) 20 MG tablet Take 20 mg by mouth daily.      Marland Kitchen gabapentin (NEURONTIN) 100 MG capsule Take 100 mg by mouth at bedtime.    Marland Kitchen GLUCOSAMINE PO Take by mouth. OTC     . lovastatin (MEVACOR) 20 MG tablet Take 20 mg by mouth daily.    . meloxicam (MOBIC) 7.5 MG tablet Take 7.5 mg by mouth daily.    . montelukast (SINGULAIR) 10 MG tablet Take 10 mg by mouth daily.      . Multiple Vitamins-Minerals (CENTRUM SILVER PO) Take by mouth daily.      Marland Kitchen omeprazole (PRILOSEC) 20 MG capsule Take 1 capsule (20 mg total) by mouth 2 (two) times daily before a meal. 120 capsule 1  . potassium chloride (KLOR-CON) 10 MEQ tablet Take 10 mEq by mouth 2 (two) times daily.    . VENTOLIN HFA 108 (90 Base) MCG/ACT inhaler Inhale 1 puff into the lungs 4 (four) times daily as needed for cough, wheezing or shortness of breath.     No current facility-administered medications for this visit.    Allergies as of 08/06/2019 - Review Complete 08/06/2019  Allergen Reaction Noted  . Colchicine Itching 11/22/2011  . Sulfa antibiotics Itching 11/22/2011  . Celebrex [celecoxib] Rash 11/22/2011  . Zofran [ondansetron] Hives 06/12/2019    Family History  Problem Relation Age of Onset  . Diabetes Mother   . Heart disease Mother   . Pancreatic cancer Father     Social History   Socioeconomic History  . Marital status: Single    Spouse name: Not on file  . Number of children: Not on file  . Years of education: Not on file  . Highest education level: Not on file  Occupational History  . Not on file  Tobacco Use  . Smoking status: Current Every Day Smoker  . Smokeless tobacco: Never Used  Substance and Sexual Activity  . Alcohol use: No  . Drug use: No  . Sexual activity: Yes    Birth control/protection: Surgical  Other Topics Concern  . Not on file  Social History Narrative  . Not on file    Social Determinants of Health   Financial Resource Strain:   . Difficulty of Paying Living Expenses:   Food Insecurity:   . Worried About Programme researcher, broadcasting/film/video in the Last Year:   . Barista in the Last Year:   Transportation Needs:   . Freight forwarder (Medical):   Marland Kitchen Lack of Transportation (Non-Medical):   Physical Activity:   . Days of Exercise per Week:   . Minutes of Exercise per Session:   Stress:   . Feeling of Stress :   Social Connections:   . Frequency of Communication with Friends and Family:   . Frequency of Social Gatherings with Friends and Family:   . Attends Religious Services:   . Active Member of Clubs or Organizations:   . Attends Banker Meetings:   .  Marital Status:   Intimate Partner Violence:   . Fear of Current or Ex-Partner:   . Emotionally Abused:   Marland Kitchen Physically Abused:   . Sexually Abused:     Review of Systems: 12 system ROS is negative except as noted above with the addition of allergy, arthritis, back pain, cough, itching, night sweats, shortness of breath.   Physical Exam: General:   Alert,  well-nourished, pleasant and cooperative in NAD Head:  Normocephalic and atraumatic. Eyes:  Sclera clear, no icterus.   Conjunctiva pink. Ears:  Normal auditory acuity. Nose:  No deformity, discharge,  or lesions. Mouth:  No deformity or lesions.   Neck:  Supple; no masses or thyromegaly. Lungs:  Clear throughout to auscultation.   No wheezes. Heart:  Regular rate and rhythm; no murmurs. Abdomen:  Soft, central obesity, ventral hernia, nontender, nondistended, normal bowel sounds, no rebound or guarding. No hepatosplenomegaly.   Rectal:  Deferred  Msk:  Symmetrical. No boney deformities LAD: No inguinal or umbilical LAD Extremities:  No clubbing or edema. Neurologic:  Alert and  oriented x4;  grossly nonfocal Skin:  Intact without significant lesions or rashes. Psych:  Alert and cooperative. Normal mood and  affect.     Terrace Fontanilla L. Orvan Falconer, MD, MPH 08/06/2019, 9:43 AM

## 2019-10-07 ENCOUNTER — Other Ambulatory Visit: Payer: Self-pay

## 2019-10-07 ENCOUNTER — Emergency Department (HOSPITAL_COMMUNITY)
Admission: EM | Admit: 2019-10-07 | Discharge: 2019-10-07 | Disposition: A | Discharge status: Home / Self Care | Payer: Medicare Other | Attending: Emergency Medicine | Admitting: Emergency Medicine

## 2019-10-07 ENCOUNTER — Encounter: Payer: Medicare Other | Admitting: Gastroenterology

## 2019-10-07 ENCOUNTER — Encounter (HOSPITAL_COMMUNITY): Payer: Self-pay

## 2019-10-07 ENCOUNTER — Telehealth: Payer: Self-pay | Admitting: Gastroenterology

## 2019-10-07 ENCOUNTER — Telehealth: Payer: Self-pay | Admitting: *Deleted

## 2019-10-07 DIAGNOSIS — R55 Syncope and collapse: Secondary | ICD-10-CM | POA: Insufficient documentation

## 2019-10-07 DIAGNOSIS — Z7984 Long term (current) use of oral hypoglycemic drugs: Secondary | ICD-10-CM | POA: Insufficient documentation

## 2019-10-07 DIAGNOSIS — I10 Essential (primary) hypertension: Secondary | ICD-10-CM | POA: Insufficient documentation

## 2019-10-07 DIAGNOSIS — E119 Type 2 diabetes mellitus without complications: Secondary | ICD-10-CM | POA: Diagnosis not present

## 2019-10-07 DIAGNOSIS — J45909 Unspecified asthma, uncomplicated: Secondary | ICD-10-CM | POA: Insufficient documentation

## 2019-10-07 DIAGNOSIS — Z79899 Other long term (current) drug therapy: Secondary | ICD-10-CM | POA: Diagnosis not present

## 2019-10-07 DIAGNOSIS — M7918 Myalgia, other site: Secondary | ICD-10-CM | POA: Insufficient documentation

## 2019-10-07 DIAGNOSIS — E669 Obesity, unspecified: Secondary | ICD-10-CM | POA: Diagnosis not present

## 2019-10-07 DIAGNOSIS — F17200 Nicotine dependence, unspecified, uncomplicated: Secondary | ICD-10-CM | POA: Insufficient documentation

## 2019-10-07 LAB — BASIC METABOLIC PANEL
Anion gap: 18 — ABNORMAL HIGH (ref 5–15)
BUN: 17 mg/dL (ref 8–23)
CO2: 27 mmol/L (ref 22–32)
Calcium: 10.2 mg/dL (ref 8.9–10.3)
Chloride: 101 mmol/L (ref 98–111)
Creatinine, Ser: 1.31 mg/dL — ABNORMAL HIGH (ref 0.44–1.00)
GFR calc Af Amer: 49 mL/min — ABNORMAL LOW (ref >60)
GFR calc non Af Amer: 42 mL/min — ABNORMAL LOW (ref >60)
Glucose, Bld: 118 mg/dL — ABNORMAL HIGH (ref 70–99)
Potassium: 3.6 mmol/L (ref 3.5–5.1)
Sodium: 146 mmol/L — ABNORMAL HIGH (ref 135–145)

## 2019-10-07 LAB — URINALYSIS, ROUTINE W REFLEX MICROSCOPIC
Bacteria, UA: NONE SEEN
Bilirubin Urine: NEGATIVE
Glucose, UA: NEGATIVE mg/dL
Ketones, ur: NEGATIVE mg/dL
Leukocytes,Ua: NEGATIVE
Nitrite: NEGATIVE
Protein, ur: NEGATIVE mg/dL
Specific Gravity, Urine: 1.017 (ref 1.005–1.030)
pH: 5 (ref 5.0–8.0)

## 2019-10-07 LAB — CBC
HCT: 39.8 % (ref 36.0–46.0)
Hemoglobin: 13.8 g/dL (ref 12.0–15.0)
MCH: 27.7 pg (ref 26.0–34.0)
MCHC: 34.7 g/dL (ref 30.0–36.0)
MCV: 79.9 fL — ABNORMAL LOW (ref 80.0–100.0)
Platelets: 306 10*3/uL (ref 150–400)
RBC: 4.98 MIL/uL (ref 3.87–5.11)
RDW: 14.9 % (ref 11.5–15.5)
WBC: 10.9 10*3/uL — ABNORMAL HIGH (ref 4.0–10.5)
nRBC: 0 % (ref 0.0–0.2)

## 2019-10-07 LAB — CBG MONITORING, ED: Glucose-Capillary: 121 mg/dL — ABNORMAL HIGH (ref 70–99)

## 2019-10-07 MED ORDER — LACTATED RINGERS IV BOLUS
500.0000 mL | Freq: Once | INTRAVENOUS | Status: AC
  Administered 2019-10-07: 500 mL via INTRAVENOUS

## 2019-10-07 NOTE — Telephone Encounter (Signed)
Pt undergoing bowel prep for colonoscopy today. She relates she passed out on the commode last night around 9 pm after completing the pm bowel prep. She woke slumped against the wall, still on the commode. She did not fall. Then she got up, went to bed and fell asleep. She awoke this morning and feels dizzy with a swollen tongue and then she called the answering service. She lives alone.  Advised to check her blood glucose now, have juice if it is low and call a friend to take her to the closest ED immediately for evaluation. If a friend is not available then call EMS. Cancel and reschedule colonoscopy, EGD.

## 2019-10-07 NOTE — Telephone Encounter (Signed)
Please call the patient to see how she is doing this morning. Thank you.

## 2019-10-07 NOTE — Discharge Instructions (Addendum)
Please return to the ER with any new or worsening symptoms. It was a pleasure to meet you and your daughter.

## 2019-10-07 NOTE — ED Provider Notes (Signed)
[facility] DEPT Provider Note   CSN: [id number] Arrival date & time: 10/07/19  1011     History Chief Complaint  Patient presents with  . Loss of Consciousness    Lauren Cantu is a 64 y.o. female.  HPI   Patient is a 64 year old female with a history of hypertension, asthma, GERD, hyperlipidemia, obesity, diabetes mellitus, who presents to the emergency department due to a syncopal episode.  Patient states that she has an upcoming colonoscopy.  Last night she began taking Suprep for this.  She states that she has had about 6 watery bowel movements in the last 12 hours.  She states this morning she was feeling lightheaded and went to the restroom.  She lost consciousness and woke up next to the toilet.  She is unsure of how long she was unconscious.  Unsure of any head trauma.  Not on blood thinners.  Patient reports some tongue swelling this morning but denies any currently.  She also reports some mild fatigue but otherwise has no complaints at this time.  No fevers, chills, chest pain, shortness of breath, abdominal pain, nausea, vomiting, constipation.     Past Medical History:  Diagnosis Date  . Arthritis   . Essential hypertension, malignant   . Extrinsic asthma, unspecified   . GERD (gastroesophageal reflux disease)   . Hyperlipidemia   . Nonspecific elevation of levels of transaminase or lactic acid dehydrogenase (LDH)   . Obesity, unspecified   . Pain in joint, site unspecified   . Type II or unspecified type diabetes mellitus without mention of complication, not stated as uncontrolled     There are no problems to display for this patient.   Past Surgical History:  Procedure Laterality Date  . ABDOMINAL HYSTERECTOMY       OB History    Gravida  2   Para  1   Term      Preterm      AB  1   Living        SAB  1   TAB      Ectopic      Multiple      Live Births              Family History  Problem Relation  Age of Onset  . Diabetes Mother   . Heart disease Mother   . Pancreatic cancer Father     Social History   Tobacco Use  . Smoking status: Current Every Day Smoker  . Smokeless tobacco: Never Used  Substance Use Topics  . Alcohol use: No  . Drug use: No    Home Medications Prior to Admission medications   Medication Sig Start Date End Date Taking? Authorizing Provider  allopurinol (ZYLOPRIM) 300 MG tablet Take 300 mg by mouth daily.      [provider]  alum & mag hydroxide-simeth (MAALOX/MYLANTA) 200-200-20 MG/5ML suspension Take 30 mLs by mouth every 6 (six) hours as needed for indigestion or heartburn.    [provider]  amLODipine (NORVASC) 10 MG tablet Take 10 mg by mouth daily. 05/27/19   [provider]  benazepril (LOTENSIN) 40 MG tablet Take 40 mg by mouth daily. 05/27/19   [provider]  Calcium Carbonate-Vitamin D (CALTRATE 600+D PO) Take 2 tablets by mouth daily.      [provider]  CALCIUM-MAGNESIUM-ZINC PO Take by mouth daily.      [provider]  FLOVENT HFA 110 MCG/ACT inhaler  SMARTSIG:1-2 Puff(s) By Mouth Twice Daily 07/24/19   [provider]  furosemide (LASIX) 20 MG tablet Take 20 mg by mouth daily.      [provider]  gabapentin (NEURONTIN) 100 MG capsule Take 100 mg by mouth at bedtime.    [provider]  GLUCOSAMINE PO Take by mouth. OTC     [provider]  lovastatin (MEVACOR) 20 MG tablet Take 20 mg by mouth daily. 05/27/19   [provider]  meloxicam (MOBIC) 7.5 MG tablet Take 7.5 mg by mouth daily. 05/27/19   [provider]  metFORMIN (GLUCOPHAGE-XR) 500 MG 24 hr tablet Take 500 mg by mouth daily. 08/25/19   [provider]  montelukast (SINGULAIR) 10 MG tablet Take 10 mg by mouth daily.      [provider]  Multiple Vitamins-Minerals (CENTRUM SILVER PO) Take by mouth daily.      [provider]  Na Sulfate-K  Sulfate-Mg Sulf (SUPREP BOWEL PREP KIT) 17.5-3.13-1.6 GM/177ML SOLN Take 1 kit by mouth as directed. For colonoscopy prep 08/06/19   Thornton Park, MD  omeprazole (PRILOSEC) 40 MG capsule Take 1 capsule (40 mg total) by mouth 2 (two) times daily. 08/06/19   Thornton Park, MD  potassium chloride (KLOR-CON) 10 MEQ tablet Take 10 mEq by mouth 2 (two) times daily. 05/27/19   [provider]  VENTOLIN HFA 108 (90 Base) MCG/ACT inhaler Inhale 1 puff into the lungs 4 (four) times daily as needed for cough, wheezing or shortness of breath. 06/09/19   [provider]    Allergies    Colchicine, Sulfa antibiotics, Celebrex [celecoxib], and Zofran [ondansetron]  Review of Systems   Review of Systems  All other systems reviewed and are negative. Ten systems reviewed and are negative for acute change, except as noted in the HPI.   Physical Exam Updated Vital Signs BP (!) 125/103   Pulse 82   Temp 98.2 F (36.8 C) (Oral)   Resp 20   SpO2 100%   Physical Exam Vitals and nursing note reviewed.  Constitutional:      General: She is not in acute distress.    Appearance: Normal appearance. She is obese. She is not ill-appearing, toxic-appearing or diaphoretic.  HENT:     Head: Normocephalic and atraumatic.     Comments: No visible or palpable signs of trauma.     Right Ear: External ear normal.     Left Ear: External ear normal.     Nose: Nose normal.     Mouth/Throat:     Mouth: Mucous membranes are moist.     Pharynx: Oropharynx is clear. No oropharyngeal exudate or posterior oropharyngeal erythema.     Comments: No tongue swelling or lacerations.  No angioedema noted.  Uvula midline.  Patient readily handling secretions. Eyes:     General: No scleral icterus.       Right eye: No discharge.        Left eye: No discharge.     Extraocular Movements: Extraocular movements intact.     Conjunctiva/sclera: Conjunctivae normal.     Pupils: Pupils are equal, round, and  reactive to light.  Neck:     Comments: No midline C-spine tenderness. Cardiovascular:     Rate and Rhythm: Normal rate and regular rhythm.     Pulses: Normal pulses.     Heart sounds: Normal heart sounds. No murmur heard.  No friction rub. No gallop.      Comments: Heart is regular rate  and rhythm.  No tachycardia.  No murmurs, rubs, gallops. Pulmonary:     Effort: Pulmonary effort is normal. No respiratory distress.     Breath sounds: Normal breath sounds. No stridor. No wheezing, rhonchi or rales.  Abdominal:     General: Abdomen is flat.     Palpations: Abdomen is soft.     Tenderness: There is no abdominal tenderness.  Musculoskeletal:        General: Tenderness present. Normal range of motion.     Cervical back: Normal range of motion and neck supple. No tenderness.     Comments: Mild tenderness appreciated over the left deltoid.  No edema or erythema.  Full range of motion of the left shoulder.  No bony tenderness.  Skin:    General: Skin is warm and dry.  Neurological:     General: No focal deficit present.     Mental Status: She is alert and oriented to person, place, and time.     Comments: Patient is oriented to person, place, and time. Patient phonates in clear, complete, and coherent sentences. Strength is 5/5 in all four extremities. Distal sensation intact in all four extremities.   Psychiatric:        Mood and Affect: Mood normal.        Behavior: Behavior normal.    ED Results / Procedures / Treatments   Labs (all labs ordered are listed, but only abnormal results are displayed) Labs Reviewed  BASIC METABOLIC PANEL - Abnormal; Notable for the following components:      Result Value   Sodium 146 (*)    Glucose, Bld 118 (*)    Creatinine, Ser 1.31 (*)    GFR calc non Af Amer 42 (*)    GFR calc Af Amer 49 (*)    Anion gap 18 (*)    All other components within normal limits  CBC - Abnormal; Notable for the following components:   WBC 10.9 (*)    MCV 79.9  (*)    All other components within normal limits  URINALYSIS, ROUTINE W REFLEX MICROSCOPIC - Abnormal; Notable for the following components:   Hgb urine dipstick SMALL (*)    All other components within normal limits  CBG MONITORING, ED - Abnormal; Notable for the following components:   Glucose-Capillary 121 (*)    All other components within normal limits   EKG EKG Interpretation  Date/Time:  Tuesday October 07 2019 10:20:26 EDT Ventricular Rate:  88 PR Interval:    QRS Duration: 72 QT Interval:  364 QTC Calculation: 441 R Axis:   47 Text Interpretation: Sinus rhythm Baseline wander in lead(s) V5 12 Lead; Mason-Likar Confirmed by Gerlene Fee [phone]) on 10/07/2019 10:59:29 AM   Radiology No results found.  Procedures Procedures (including critical care time)  Medications Ordered in ED Medications  lactated ringers bolus 500 mL ( Intravenous Rate/Dose Verify 10/07/19 1226)   ED Course  I have reviewed the triage vital signs and the nursing notes.  Pertinent labs & imaging results that were available during my care of the patient were reviewed by me and considered in my medical decision making (see chart for details).  Clinical Course as of Oct 06 1236  Tue Oct 07, 2019  1237 Patient reassessed.  She is still receiving her IV fluids.  She states she is feeling fine.  I obtained orthostatic vital signs.  Systolic blood pressure when lying down was about 126.  When standing it was about 130.  Will  have patient finish her fluids and will discharge home.  She has been in touch with her gastroenterologist regarding this incident.   [LJ]    Clinical Course User Index [LJ] Rayna Sexton, PA-C   MDM Rules/Calculators/A&P                          Pt is a 64 y.o. female that presents with a history, physical exam, and ED Clinical Course as noted above.   Patient presents today due to what appears to be a syncopal episode.  Patient started bowel prep yesterday evening for  an upcoming colonoscopy.  Physical exam today is reassuring.  Neurological exam is benign.  Patient was discussed with and evaluated by my attending physician as well. Given no signs of head trauma, no anticoagulation, and a normal neuro exam, deferred a CT of the head. My attending physician is in agreement.   Basic labs are significant for a mildly elevated creatinine of 1.31 with a GFR of 42.  Mild anion gap of 18.  Believe her lab results today as well as her syncopal episode are likely the result of some mild dehydration.  Patient was given 500 cc of IV fluids and feels fine, per patient.  Patient is eager to be discharged.  I will let her finish her remaining fluids before leaving the ED.  Orthostatic vital signs were obtained on 2 different occasions and were reassuring. Pt able to stand and ambulate unassisted without difficulty. She is planning on following up with her gastroenterologist regarding this visit as well as her sx.   Patient is hemodynamically stable and in NAD at the time of d/c. Evaluation does not show pathology that would require ongoing emergent intervention or inpatient treatment. I explained the diagnosis to the patient. Patient is comfortable with above plan and is stable for discharge at this time. All questions were answered prior to disposition. Strict return precautions for returning to the ED were discussed. Encouraged follow up with PCP.    An After Visit Summary was printed and given to the patient.  Patient discharged to home/self care.  Condition at discharge: Stable  Note: Portions of this report may have been transcribed using voice recognition software. Every effort was made to ensure accuracy; however, inadvertent computerized transcription errors may be present.   Final Clinical Impression(s) / ED Diagnoses Final diagnoses:  Syncope, unspecified syncope type    Rx / DC Orders ED Discharge Orders    None       Rayna Sexton, PA-C 10/07/19  1247    Maudie Flakes, MD 10/07/19 1521

## 2019-10-07 NOTE — Telephone Encounter (Signed)
Spoke with pt and she states she has just now pulled up to the ER at [facility] to be evaluated. She sounds good over the phone and state she does feel better but this was a very unusual reaction for her. She is going in to get checked out in the ER. Dr. Orvan Falconer notified.

## 2019-10-07 NOTE — Telephone Encounter (Signed)
Thank you for the update. I agree with plans for ER evaluation. I hope she continues to feel better.

## 2019-10-07 NOTE — ED Triage Notes (Addendum)
Pt presents with c/o positive LOC. Pt reports she was taking colonoscopy prep, started to feel lightheaded approx 45 minutes later, walked to the bathroom, and woke up a few minutes later. Pt reports she does still feel somewhat dizzy. Pt reports when she woke up, her tongue was swollen so she is worried she had a reaction to the contrast. Pt reports she does not feel in any distress at this time r/t the reaction.

## 2019-10-07 NOTE — Telephone Encounter (Signed)
Pt had called and spoke with Dr. Russella Dar this morning and stated that she had passed out in the bathroom after taking her prep this morning, she also has a swollen tongue. I called to check on her and she is going to go to [facility] for evaluation. Notified Dr. Orvan Falconer.

## 2019-12-17 ENCOUNTER — Other Ambulatory Visit: Payer: Self-pay | Admitting: Internal Medicine

## 2019-12-17 DIAGNOSIS — R7989 Other specified abnormal findings of blood chemistry: Secondary | ICD-10-CM

## 2020-04-23 ENCOUNTER — Other Ambulatory Visit: Payer: Self-pay | Admitting: Internal Medicine

## 2020-04-23 DIAGNOSIS — J329 Chronic sinusitis, unspecified: Secondary | ICD-10-CM

## 2020-04-23 DIAGNOSIS — R519 Headache, unspecified: Secondary | ICD-10-CM

## 2020-04-23 DIAGNOSIS — F17200 Nicotine dependence, unspecified, uncomplicated: Secondary | ICD-10-CM

## 2020-05-03 ENCOUNTER — Other Ambulatory Visit: Payer: Medicare Other

## 2020-05-07 ENCOUNTER — Ambulatory Visit
Admission: RE | Admit: 2020-05-07 | Discharge: 2020-05-07 | Disposition: A | Discharge status: Home / Self Care | Payer: Medicare Other | Source: Ambulatory Visit | Attending: Internal Medicine | Admitting: Internal Medicine

## 2020-05-07 DIAGNOSIS — J329 Chronic sinusitis, unspecified: Secondary | ICD-10-CM

## 2020-05-07 DIAGNOSIS — R519 Headache, unspecified: Secondary | ICD-10-CM

## 2020-05-07 DIAGNOSIS — F17200 Nicotine dependence, unspecified, uncomplicated: Secondary | ICD-10-CM

## 2021-03-24 ENCOUNTER — Emergency Department (HOSPITAL_COMMUNITY): Payer: Medicare Other

## 2021-03-24 ENCOUNTER — Emergency Department (HOSPITAL_COMMUNITY)
Admission: EM | Admit: 2021-03-24 | Discharge: 2021-03-25 | Disposition: A | Discharge status: Home / Self Care | Payer: Medicare Other | Attending: Emergency Medicine | Admitting: Emergency Medicine

## 2021-03-24 ENCOUNTER — Other Ambulatory Visit: Payer: Self-pay

## 2021-03-24 ENCOUNTER — Encounter (HOSPITAL_COMMUNITY): Payer: Self-pay

## 2021-03-24 DIAGNOSIS — R4782 Fluency disorder in conditions classified elsewhere: Secondary | ICD-10-CM | POA: Insufficient documentation

## 2021-03-24 DIAGNOSIS — I10 Essential (primary) hypertension: Secondary | ICD-10-CM | POA: Insufficient documentation

## 2021-03-24 DIAGNOSIS — I671 Cerebral aneurysm, nonruptured: Secondary | ICD-10-CM

## 2021-03-24 DIAGNOSIS — E119 Type 2 diabetes mellitus without complications: Secondary | ICD-10-CM | POA: Insufficient documentation

## 2021-03-24 DIAGNOSIS — Z7984 Long term (current) use of oral hypoglycemic drugs: Secondary | ICD-10-CM | POA: Insufficient documentation

## 2021-03-24 DIAGNOSIS — F8081 Childhood onset fluency disorder: Secondary | ICD-10-CM | POA: Diagnosis not present

## 2021-03-24 DIAGNOSIS — Z7901 Long term (current) use of anticoagulants: Secondary | ICD-10-CM | POA: Diagnosis not present

## 2021-03-24 DIAGNOSIS — Z79899 Other long term (current) drug therapy: Secondary | ICD-10-CM | POA: Diagnosis not present

## 2021-03-24 DIAGNOSIS — Z20822 Contact with and (suspected) exposure to covid-19: Secondary | ICD-10-CM | POA: Diagnosis not present

## 2021-03-24 DIAGNOSIS — R42 Dizziness and giddiness: Secondary | ICD-10-CM | POA: Diagnosis present

## 2021-03-24 DIAGNOSIS — H6691 Otitis media, unspecified, right ear: Secondary | ICD-10-CM

## 2021-03-24 LAB — CBG MONITORING, ED: Glucose-Capillary: 150 mg/dL — ABNORMAL HIGH (ref 70–99)

## 2021-03-24 LAB — COMPREHENSIVE METABOLIC PANEL
ALT: 19 U/L (ref 0–44)
AST: 30 U/L (ref 15–41)
Albumin: 3.8 g/dL (ref 3.5–5.0)
Alkaline Phosphatase: 104 U/L (ref 38–126)
Anion gap: 11 (ref 5–15)
BUN: 17 mg/dL (ref 8–23)
CO2: 20 mmol/L — ABNORMAL LOW (ref 22–32)
Calcium: 10 mg/dL (ref 8.9–10.3)
Chloride: 108 mmol/L (ref 98–111)
Creatinine, Ser: 0.97 mg/dL (ref 0.44–1.00)
GFR, Estimated: >60 mL/min (ref >60)
Glucose, Bld: 128 mg/dL — ABNORMAL HIGH (ref 70–99)
Potassium: 4.2 mmol/L (ref 3.5–5.1)
Sodium: 139 mmol/L (ref 135–145)
Total Bilirubin: 0.5 mg/dL (ref 0.3–1.2)
Total Protein: 7.2 g/dL (ref 6.5–8.1)

## 2021-03-24 LAB — APTT: aPTT: 21 seconds — ABNORMAL LOW (ref 24–36)

## 2021-03-24 LAB — CBC WITH DIFFERENTIAL/PLATELET
Abs Immature Granulocytes: 0.03 10*3/uL (ref 0.00–0.07)
Basophils Absolute: 0.1 10*3/uL (ref 0.0–0.1)
Basophils Relative: 1 %
Eosinophils Absolute: 0.2 10*3/uL (ref 0.0–0.5)
Eosinophils Relative: 2 %
HCT: 41.9 % (ref 36.0–46.0)
Hemoglobin: 14.2 g/dL (ref 12.0–15.0)
Immature Granulocytes: 0 %
Lymphocytes Relative: 43 %
Lymphs Abs: 3.6 10*3/uL (ref 0.7–4.0)
MCH: 27.9 pg (ref 26.0–34.0)
MCHC: 33.9 g/dL (ref 30.0–36.0)
MCV: 82.3 fL (ref 80.0–100.0)
Monocytes Absolute: 0.6 10*3/uL (ref 0.1–1.0)
Monocytes Relative: 7 %
Neutro Abs: 3.9 10*3/uL (ref 1.7–7.7)
Neutrophils Relative %: 47 %
Platelets: 317 10*3/uL (ref 150–400)
RBC: 5.09 MIL/uL (ref 3.87–5.11)
RDW: 14.9 % (ref 11.5–15.5)
WBC: 8.4 10*3/uL (ref 4.0–10.5)
nRBC: 0 % (ref 0.0–0.2)

## 2021-03-24 LAB — RESP PANEL BY RT-PCR (FLU A&B, COVID) ARPGX2
Influenza A by PCR: NEGATIVE
Influenza B by PCR: NEGATIVE
SARS Coronavirus 2 by RT PCR: NEGATIVE

## 2021-03-24 LAB — I-STAT CHEM 8, ED
BUN: 19 mg/dL (ref 8–23)
Calcium, Ion: 1.15 mmol/L (ref 1.15–1.40)
Chloride: 111 mmol/L (ref 98–111)
Creatinine, Ser: 0.9 mg/dL (ref 0.44–1.00)
Glucose, Bld: 123 mg/dL — ABNORMAL HIGH (ref 70–99)
HCT: 45 % (ref 36.0–46.0)
Hemoglobin: 15.3 g/dL — ABNORMAL HIGH (ref 12.0–15.0)
Potassium: 4.2 mmol/L (ref 3.5–5.1)
Sodium: 141 mmol/L (ref 135–145)
TCO2: 25 mmol/L (ref 22–32)

## 2021-03-24 LAB — RAPID URINE DRUG SCREEN, HOSP PERFORMED
Amphetamines: NOT DETECTED
Barbiturates: NOT DETECTED
Benzodiazepines: NOT DETECTED
Cocaine: NOT DETECTED
Opiates: NOT DETECTED
Tetrahydrocannabinol: NOT DETECTED

## 2021-03-24 LAB — URINALYSIS, ROUTINE W REFLEX MICROSCOPIC
Bilirubin Urine: NEGATIVE
Glucose, UA: NEGATIVE mg/dL
Hgb urine dipstick: NEGATIVE
Ketones, ur: NEGATIVE mg/dL
Leukocytes,Ua: NEGATIVE
Nitrite: NEGATIVE
Protein, ur: NEGATIVE mg/dL
Specific Gravity, Urine: 1.008 (ref 1.005–1.030)
pH: 6 (ref 5.0–8.0)

## 2021-03-24 LAB — PROTIME-INR
INR: 1 (ref 0.8–1.2)
Prothrombin Time: 13.3 seconds (ref 11.4–15.2)

## 2021-03-24 MED ORDER — SODIUM CHLORIDE 0.9 % IV BOLUS
500.0000 mL | Freq: Once | INTRAVENOUS | Status: AC
  Administered 2021-03-24: 21:00:00 500 mL via INTRAVENOUS

## 2021-03-24 MED ORDER — SODIUM CHLORIDE 0.9 % IV SOLN
100.0000 mL/h | INTRAVENOUS | Status: DC
  Administered 2021-03-24: 21:00:00 100 mL/h via INTRAVENOUS

## 2021-03-24 MED ORDER — ASPIRIN 325 MG PO TABS
325.0000 mg | ORAL_TABLET | Freq: Once | ORAL | Status: AC
  Administered 2021-03-24: 21:00:00 325 mg via ORAL
  Filled 2021-03-24: qty 1

## 2021-03-24 MED ORDER — LORAZEPAM 2 MG/ML IJ SOLN: 0.5000 mg | Freq: Once | INTRAMUSCULAR | Status: DC | PRN

## 2021-03-24 NOTE — ED Triage Notes (Signed)
BIB [facility] from a restaurant. Pt was sitting and eating dinner and all of a sudden had a sudden onset of dizziness, lt side neck pain, and chills. Similar episodes and it was dx as vertigo. Per pt rt arm is weak but she is moving it without difficulty per EMS. LKN 1830.PIV 20ga lt fa. Cbg 139  ?

## 2021-03-24 NOTE — Code Documentation (Signed)
Responded to Code Stroke called at 2018 on pt already in the ED. Pt came to the ED with c/o dizziness and L neck pain. Code Stroke was called when pt was noticed to have some aphasia and dysarthria, LSN-1830, CBG-123. NIH completed in CT and was 4 for BLE weakness, CT head negative. TNK not given-nonfocal exam. Code Stroke cancelled at 2035.  ?

## 2021-03-24 NOTE — ED Notes (Signed)
In room to complete triage and assessment. Found pt to be very lethargic, when she would speak it was with a stutter and slurred speech and she also had a difficult time coming up with words. Pt was weak and unable to hold up her arms and her legs. At this time located ED provider and brought provider to the room. It was determined by the provider to call a code CVA. Labs were obtained at this time, contacted ct to determine which room to transport pt too. At 2020pm pt was transported to ct scan 3. While in ct Neuro team and provider arrived to visit pt. Once ct was completed and pt was cleared pt was transported back to room. ?

## 2021-03-24 NOTE — ED Notes (Signed)
Pt is more alert now then when she arrived. However she is  still having difficulty answering questions. She stills has a stutter and mild slurred speech. It also seams that she is having difficulty getting words out. ?

## 2021-03-24 NOTE — ED Notes (Signed)
Provider at bedside at this time

## 2021-03-24 NOTE — Consult Note (Signed)
Neurology Consult H&P ? ?Oleh Genin ?MR# [id number] ?03/24/2021 ? ? ?CC: stuttering speech ? ?History is obtained from: patient, EMS, ED staff and chart. ? ?HPI: Lauren Cantu is a 66 y.o. female PMHx as reviewed below, previous diagnosis of vertigo was in a restaurant eating dinner and had sudden onset of dizziness with associated left sided neck pain, and chills. Similar episodes which were diagnosed as vertigo. ? ?LKW: 7209 ?tNK given: No symptoms too mild ?IR Thrombectomy No, not indicated ?Modified Rankin Scale: 0-Completely asymptomatic and back to baseline post- stroke ?NIHSS: 4 b/l lower extremity ? ?ROS: A complete ROS was performed and is negative except as noted in the HPI.  ?Past Medical History:  ?Diagnosis Date  ? Arthritis   ? Essential hypertension, malignant   ? Extrinsic asthma, unspecified   ? GERD (gastroesophageal reflux disease)   ? Hyperlipidemia   ? Nonspecific elevation of levels of transaminase or lactic acid dehydrogenase (LDH)   ? Obesity, unspecified   ? Pain in joint, site unspecified   ? Type II or unspecified type diabetes mellitus without mention of complication, not stated as uncontrolled   ? ? ? ?Family History  ?Problem Relation Age of Onset  ? Diabetes Mother   ? Heart disease Mother   ? Pancreatic cancer Father   ? ? ?Social History:  reports that she has been smoking. She has never used smokeless tobacco. She reports that she does not drink alcohol and does not use drugs. ? ? ?Prior to Admission medications   ?Medication Sig Start Date End Date Taking? Authorizing Provider  ?allopurinol (ZYLOPRIM) 300 MG tablet Take 300 mg by mouth daily.      [provider]  ?alum & mag hydroxide-simeth (MAALOX/MYLANTA) 200-200-20 MG/5ML suspension Take 30 mLs by mouth every 6 (six) hours as needed for indigestion or heartburn.    [provider]  ?amLODipine (NORVASC) 10 MG tablet Take 10 mg by mouth daily. 05/27/19   [provider]  ?benazepril (LOTENSIN) 40  MG tablet Take 40 mg by mouth daily. 05/27/19   [provider]  ?Calcium Carbonate-Vitamin D (CALTRATE 600+D PO) Take 2 tablets by mouth daily.      [provider]  ?CALCIUM-MAGNESIUM-ZINC PO Take by mouth daily.      [provider]  ?FLOVENT HFA 110 MCG/ACT inhaler SMARTSIG:1-2 Puff(s) By Mouth Twice Daily 07/24/19   [provider]  ?furosemide (LASIX) 20 MG tablet Take 20 mg by mouth daily.      [provider]  ?gabapentin (NEURONTIN) 100 MG capsule Take 100 mg by mouth at bedtime.    [provider]  ?GLUCOSAMINE PO Take by mouth. OTC     [provider]  ?lovastatin (MEVACOR) 20 MG tablet Take 20 mg by mouth daily. 05/27/19   [provider]  ?meloxicam (MOBIC) 7.5 MG tablet Take 7.5 mg by mouth daily. 05/27/19   [provider]  ?metFORMIN (GLUCOPHAGE-XR) 500 MG 24 hr tablet Take 500 mg by mouth daily. 08/25/19   [provider]  ?montelukast (SINGULAIR) 10 MG tablet Take 10 mg by mouth daily.      [provider]  ?Multiple Vitamins-Minerals (CENTRUM SILVER PO) Take by mouth daily.      [provider]  ?Na Sulfate-K Sulfate-Mg Sulf (SUPREP BOWEL PREP KIT) 17.5-3.13-1.6 GM/177ML SOLN Take 1 kit by mouth as directed. For colonoscopy prep 08/06/19   Thornton Park, MD  ?omeprazole (PRILOSEC) 40 MG capsule Take 1 capsule (40 mg  total) by mouth 2 (two) times daily. 08/06/19   Thornton Park, MD  ?potassium chloride (KLOR-CON) 10 MEQ tablet Take 10 mEq by mouth 2 (two) times daily. 05/27/19   [provider]  ?VENTOLIN HFA 108 (90 Base) MCG/ACT inhaler Inhale 1 puff into the lungs 4 (four) times daily as needed for cough, wheezing or shortness of breath. 06/09/19   [provider]  ? ? ?Exam: ?Current vital signs: ?BP 138/71   Pulse 93   Temp 97.8 ?F (36.6 ?C) (Oral)   Resp 19   Ht _0  (1.575 m)   Wt 89.4 kg   SpO2 96%   BMI 36.03 kg/m?  ? ?Physical Exam  ?Constitutional: Appears  well-developed and well-nourished.  ?Psych: Affect appropriate to situation ?Eyes: No scleral injection ?HENT: No OP obstruction. ?Head: Normocephalic.  ?Cardiovascular: Normal rate and regular rhythm.  ?Respiratory: Effort normal, symmetric excursions bilaterally, no audible wheezing. ?GI: Soft.  No distension. There is no tenderness.  ?Skin: WDI ? ?Neuro: ?Mental Status: ?Patient is awake, alert, oriented to person, place, month, year, and situation. ?Patient is able to give a clear and coherent history. ?Speech waxing and waning stuttering speech however, fluent, intact comprehension and repetition. ?No signs of aphasia or neglect. ?Visual Fields are full. Pupils are equal, round, and reactive to light. ?EOMI without ptosis or diplopia.  ?Facial sensation is symmetric to temperature ?Facial movement is symmetric.  ?Hearing is intact to voice. ?Uvula midline and palate elevates symmetrically. ?Shoulder shrug is symmetric. ?Tongue is midline without atrophy or fasciculations.  ?Tone is normal. Bulk is normal. 5/5 strength upper, 4/5 lowers limited by development of cramps in her thighs.  ?Sensation is symmetric to light touch and temperature in the arms and legs. ?Deep Tendon Reflexes: 2+ and symmetric in the biceps and patellae. ?Toes are downgoing bilaterally. ?FNF and HKS are intact bilaterally. ?Gait - Deferred ? ?I have reviewed labs in epic and the pertinent results are: ?CBG-123 ? ?I have reviewed the images obtained: ?NCT head showed no acute ischemic changes, hemorrhage or mass, ASPECTS is 10. ? ?Assessment: Lauren Cantu is a 66 y.o. female PMHx as noted above, vertigo presents with episode of dizziness which has resolved. Lower extremities with 4/5 strength however, limited by development of painful cramps in her thighs. Stuttering speech is fluctuating without clear aphasia (intact fluency, comprehension, repetition). Her symptoms are not entirely suggestive of TIA or stroke.  Vertigo has resolved,  there was no nystagmus and her exam is reassuring.  ? ?Recommended aspirin 327m now as a precautionary measure. ? ?Please cancel code stroke. ? ? ?Plan: ?- MRI brain without contrast and MRA head and neck - If suggestive please call neurology. She may otherwise be discharged with PCP follow up. ?- Consider aspirin 814muntil she has PCP follow up for further assessment. ?- Neurology will remain available, please call for questions. ? ?This patient is critically ill and at significant risk of neurological worsening, death and care requires constant monitoring of vital signs, hemodynamics,respiratory and cardiac monitoring, neurological assessment, discussion with family, other specialists and medical decision making of high complexity. I spent 73 minutes of neurocritical care time  in the care of  this patient. This was time spent independent of any time provided by nurse practitioner or PA. ? ?Electronically signed by:  ?HuLynnae SandhoffMD ?Page: [id number]/09/2021, 10:08 PM ? ?If 7pm- 7am, please page neurology on call as listed in [place]? ?

## 2021-03-24 NOTE — ED Provider Notes (Signed)
[place] EMERGENCY DEPARTMENT Provider Note   CSN: [id number] Arrival date & time: 03/24/21  4627  An emergency department physician performed an initial assessment on this suspected stroke patient at 2018.  History  Chief Complaint  Patient presents with   Dizziness    Lauren Cantu is a 66 y.o. female.   Dizziness  Patient has a history of hypertension, reflux, hyperlipidemia, diabetes.  Patient presents to the ED with complaints of acute speech disturbance and weakness.  Patient was with her daughter.  She felt fine this evening.  She had sudden onset of feeling dizzy lightheaded and she fell to the side.  Patient also then started having difficulty with her speech.  Patient states she did not feel right and asked her daughter to call 911.  Patient denied any trouble with her vision.  She did have some mild left-sided neck pain but no headache.  Patient denies any fevers or chills.  No chest pain or shortness of breath.  Patient continues to have difficulty with her speech.  EMS did not activate the patient as a code stroke but the patient's symptoms were recognized by the nurse and code stroke was activated on arrival  Home Medications Prior to Admission medications   Medication Sig Start Date End Date Taking? Authorizing Provider  allopurinol (ZYLOPRIM) 300 MG tablet Take 300 mg by mouth daily.      [provider]  alum & mag hydroxide-simeth (MAALOX/MYLANTA) 200-200-20 MG/5ML suspension Take 30 mLs by mouth every 6 (six) hours as needed for indigestion or heartburn.    [provider]  amLODipine (NORVASC) 10 MG tablet Take 10 mg by mouth daily. 05/27/19   [provider]  benazepril (LOTENSIN) 40 MG tablet Take 40 mg by mouth daily. 05/27/19   [provider]  Calcium Carbonate-Vitamin D (CALTRATE 600+D PO) Take 2 tablets by mouth daily.      [provider]  CALCIUM-MAGNESIUM-ZINC PO Take by mouth daily.       [provider]  FLOVENT HFA 110 MCG/ACT inhaler SMARTSIG:1-2 Puff(s) By Mouth Twice Daily 07/24/19   [provider]  furosemide (LASIX) 20 MG tablet Take 20 mg by mouth daily.      [provider]  gabapentin (NEURONTIN) 100 MG capsule Take 100 mg by mouth at bedtime.    [provider]  GLUCOSAMINE PO Take by mouth. OTC     [provider]  lovastatin (MEVACOR) 20 MG tablet Take 20 mg by mouth daily. 05/27/19   [provider]  meloxicam (MOBIC) 7.5 MG tablet Take 7.5 mg by mouth daily. 05/27/19   [provider]  metFORMIN (GLUCOPHAGE-XR) 500 MG 24 hr tablet Take 500 mg by mouth daily. 08/25/19   [provider]  montelukast (SINGULAIR) 10 MG tablet Take 10 mg by mouth daily.      [provider]  Multiple Vitamins-Minerals (CENTRUM SILVER PO) Take by mouth daily.      [provider]  Na Sulfate-K Sulfate-Mg Sulf (SUPREP BOWEL PREP KIT) 17.5-3.13-1.6 GM/177ML SOLN Take 1 kit by mouth as directed. For colonoscopy prep 08/06/19   Thornton Park, MD  omeprazole (PRILOSEC) 40 MG capsule Take 1 capsule (40 mg total) by mouth 2 (two) times daily. 08/06/19   Thornton Park, MD  potassium chloride (KLOR-CON) 10 MEQ tablet Take 10 mEq by mouth 2 (two) times daily. 05/27/19   [provider]  VENTOLIN HFA 108 (90 Base) MCG/ACT inhaler Inhale 1 puff into  the lungs 4 (four) times daily as needed for cough, wheezing or shortness of breath. 06/09/19   [provider]      Allergies    Colchicine, Lemon eucalyptus (citriodora) oil, Orange fruit [citrus], Pineapple, Sulfa antibiotics, Celebrex [celecoxib], and Zofran [ondansetron]    Review of Systems   Review of Systems  Constitutional:  Negative for fever.  Neurological:  Positive for dizziness.   Physical Exam Updated Vital Signs BP 128/72    Pulse 85    Temp 97.8 F (36.6 C) (Oral)    Resp 15    Ht 1.575 m ($Remove'5\' 2"'UVOSlFg$ )    Wt 89.4 kg    SpO2 97%     BMI 36.03 kg/m  Physical Exam Vitals and nursing note reviewed.  Constitutional:      General: She is not in acute distress.    Appearance: She is well-developed.  HENT:     Head: Normocephalic and atraumatic.     Right Ear: External ear normal.     Left Ear: External ear normal.  Eyes:     General: No scleral icterus.       Right eye: No discharge.        Left eye: No discharge.     Conjunctiva/sclera: Conjunctivae normal.  Neck:     Trachea: No tracheal deviation.  Cardiovascular:     Rate and Rhythm: Normal rate and regular rhythm.  Pulmonary:     Effort: Pulmonary effort is normal. No respiratory distress.     Breath sounds: Normal breath sounds. No stridor. No wheezing or rales.  Abdominal:     General: Bowel sounds are normal. There is no distension.     Palpations: Abdomen is soft.     Tenderness: There is no abdominal tenderness. There is no guarding or rebound.  Musculoskeletal:        General: No tenderness.     Cervical back: Neck supple.  Skin:    General: Skin is warm and dry.     Findings: No rash.  Neurological:     Mental Status: She is alert and oriented to person, place, and time.     Sensory: No sensory deficit.     Motor: No abnormal muscle tone or seizure activity.     Coordination: Coordination normal.     Comments: Difficulty lifting both arms and both legs off the bed, movements are slow sensation intact in all extremities, no visual field cuts, no left or right sided neglect, , no nystagmus noted  No facial droop, extraocular movements intact, tongue midline, stuttering speech pattern, some dysarthria,     ED Results / Procedures / Treatments   Labs (all labs ordered are listed, but only abnormal results are displayed) Labs Reviewed  APTT - Abnormal; Notable for the following components:      Result Value   aPTT 21 (*)    All other components within normal limits  COMPREHENSIVE METABOLIC PANEL - Abnormal; Notable for the following  components:   CO2 20 (*)    Glucose, Bld 128 (*)    All other components within normal limits  URINALYSIS, ROUTINE W REFLEX MICROSCOPIC - Abnormal; Notable for the following components:   Color, Urine STRAW (*)    All other components within normal limits  I-STAT CHEM 8, ED - Abnormal; Notable for the following components:   Glucose, Bld 123 (*)    Hemoglobin 15.3 (*)    All other components within normal limits  CBG MONITORING, ED - Abnormal;  Notable for the following components:   Glucose-Capillary 150 (*)    All other components within normal limits  RESP PANEL BY RT-PCR (FLU A&B, COVID) ARPGX2  PROTIME-INR  RAPID URINE DRUG SCREEN, HOSP PERFORMED  CBC WITH DIFFERENTIAL/PLATELET  ETHANOL    EKG None  Radiology CT HEAD CODE STROKE WO CONTRAST  Result Date: 03/24/2021 CLINICAL DATA:  Code stroke. EXAM: CT HEAD WITHOUT CONTRAST TECHNIQUE: Contiguous axial images were obtained from the base of the skull through the vertex without intravenous contrast. RADIATION DOSE REDUCTION: This exam was performed according to the departmental dose-optimization program which includes automated exposure control, adjustment of the mA and/or kV according to patient size and/or use of iterative reconstruction technique. COMPARISON:  None. FINDINGS: Brain: Mild age-related cerebral atrophy with chronic small vessel ischemic disease. No acute intracranial hemorrhage. No large vessel territory infarct. No mass lesion, midline shift or mass effect. No hydrocephalus or extra-axial fluid collection. Vascular: No hyperdense vessel. Scattered vascular calcifications noted within the carotid siphons. Skull: Scalp soft tissues and calvarium within normal limits. Sinuses/Orbits: Globes orbital soft tissues demonstrate no acute finding. Chronic appearing left ethmoidal and maxillary sinusitis. Paranasal sinuses are otherwise largely clear. Moderate right mastoid and middle ear effusion. Visualized nasopharynx grossly  unremarkable. Left mastoid air cells are clear. Other: None. ASPECTS [facility] Stroke Program Early CT Score) - Ganglionic level infarction (caudate, lentiform nuclei, internal capsule, insula, M1-M3 cortex): 7 - Supraganglionic infarction (M4-M6 cortex): 3 Total score (0-10 with 10 being normal): 10 IMPRESSION: 1. No acute intracranial infarct or other abnormality. 2. ASPECTS is 10. 3. Mild age-related cerebral atrophy with chronic small vessel ischemic disease. 4. Chronic left ethmoidal and maxillary sinusitis. 5. Moderate right mastoid and middle ear effusion, of uncertain significance. Correlation with physical exam recommended. These results were communicated to Dr. Theda Sers at 8:41 pm on 03/24/2021 by text page via the [facility] messaging system. Electronically Signed   By: Jeannine Boga M.D.   On: 03/24/2021 20:43    Procedures Procedures    Medications Ordered in ED Medications  sodium chloride 0.9 % bolus 500 mL (0 mLs Intravenous Stopped 03/24/21 2307)    Followed by  0.9 %  sodium chloride infusion (100 mL/hr Intravenous New Bag/Given 03/24/21 2126)  LORazepam (ATIVAN) injection 0.5 mg (has no administration in time range)  aspirin tablet 325 mg (325 mg Oral Given 03/24/21 2127)    ED Course/ Medical Decision Making/ A&P Clinical Course as of 03/24/21 2335  Thu Mar 24, 2021  2101 Concerned about the possibility of acute stroke.  Code stroke activated after my initial evaluation. [JK]  2101 CT HEAD CODE STROKE WO CONTRAST Head CT without acute findings other than findings of sinusitis [JK]  2101 Comprehensive metabolic panel(!) Normal [JK]  2205 CBC with Differential/Platelet Normal [JK]  2205 Urinalysis, Routine w reflex microscopic Urine, Clean Catch(!) Negative [JK]  2212 Case reviewed with Dr. Theda Sers.  Suspect symptoms could be related to vertigo and stress reaction.  We will proceed with MRI MRA to rule out stroke [JK]    Clinical Course User Index [JK] Dorie Rank, MD                            Medical Decision Making Amount and/or Complexity of Data Reviewed Independent Historian:     Details: Additional history from pt's daughter Labs: ordered. Decision-making details documented in ED Course. Radiology: ordered. Decision-making details documented in ED Course.  Risk Prescription drug management.  Decision regarding hospitalization.   Patient presented to the ED for evaluation of acute episode of dizziness with speech difficulty.  Presentation concerning for possibility of acute stroke, TIA.  Code stroke activated on arrival.  Patient was seen by neurology Dr. Theda Sers.  Initial head CT negative.  Not felt to be a tPA candidate.  Symptoms are improving.  Felt to be possibly vertigo with stress reaction.  Plan is for MRI and MRA of head and neck.  If negative anticipate discharge with close follow-up.  MRI pending at shift change.   Care turned over to Dr Waverly Ferrari        Final Clinical Impression(s) / ED Diagnoses Final diagnoses:  Dizziness  Stutter     Dorie Rank, MD 03/25/21 [phone]

## 2021-03-25 ENCOUNTER — Emergency Department (HOSPITAL_COMMUNITY): Payer: Medicare Other

## 2021-03-25 DIAGNOSIS — R42 Dizziness and giddiness: Secondary | ICD-10-CM | POA: Diagnosis not present

## 2021-03-25 MED ORDER — MECLIZINE HCL 25 MG PO TABS
12.5000 mg | ORAL_TABLET | Freq: Once | ORAL | Status: AC
  Administered 2021-03-25: 12.5 mg via ORAL
  Filled 2021-03-25: qty 1

## 2021-03-25 MED ORDER — AMOXICILLIN-POT CLAVULANATE 875-125 MG PO TABS: 1.0000 | ORAL_TABLET | Freq: Two times a day (BID) | ORAL | 0 refills | Status: DC

## 2021-03-25 MED ORDER — AMOXICILLIN-POT CLAVULANATE 875-125 MG PO TABS
1.0000 | ORAL_TABLET | Freq: Once | ORAL | Status: AC
  Administered 2021-03-25: 1 via ORAL
  Filled 2021-03-25: qty 1

## 2021-03-25 MED ORDER — GADOBUTROL 1 MMOL/ML IV SOLN
9.0000 mL | Freq: Once | INTRAVENOUS | Status: AC | PRN
  Administered 2021-03-25: 9 mL via INTRAVENOUS

## 2021-03-25 NOTE — ED Notes (Signed)
Pt verbalizes understanding of discharge instructions. Opportunity for questions and answers were provided. Pt discharged from the ED.   ?

## 2021-03-25 NOTE — ED Notes (Signed)
Patient transported to MRI 

## 2021-03-25 NOTE — ED Provider Notes (Signed)
?  Physical Exam  ?BP 118/63   Pulse 80   Temp 97.8 ?F (36.6 ?C) (Oral)   Resp 20   Ht 5\' 2"  (1.575 m)   Wt 89.4 kg   SpO2 96%   BMI 36.03 kg/m?  ? ?Physical Exam ? ?Procedures  ?Procedures ? ?ED Course / MDM  ? ?Clinical Course as of 03/25/21 1644  ?Thu Mar 24, 2021  ?2101 Concerned about the possibility of acute stroke.  Code stroke activated after my initial evaluation. [JK]  ?2101 CT HEAD CODE STROKE WO CONTRAST ?Head CT without acute findings other than findings of sinusitis [JK]  ?2101 Comprehensive metabolic panel(!) ?Normal [JK]  ?2205 CBC with Differential/Platelet ?Normal [JK]  ?2205 Urinalysis, Routine w reflex microscopic Urine, Clean Catch(!) ?Negative [JK]  ?2212 Case reviewed with Dr. 2213.  Suspect symptoms could be related to vertigo and stress reaction.  We will proceed with MRI MRA to rule out stroke [JK]  ?  ?Clinical Course User Index ?[JK] Thomasena Edis, MD  ? ?Medical Decision Making ?Care assumed at 3 PM.  Patient is here with dizziness. Neurology saw patient and recommend MRI brain and MRA.  Signed out pending MRI results ? ?4:44 PM ?MRI showed mastoiditis.  I think that is likely contributing to her symptoms.  Patient states that she is on azithromycin already.  She has no penicillin allergy so I will put her on Augmentin.  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  Discussed case with Dr. Linwood Dibbles.  He recommend follow-up with neuro interventional radiology and neurology.  We will also have her follow-up with ENT. ? ?Problems Addressed: ?Cerebral aneurysm: acute illness or injury ?Dizziness: acute illness or injury ?Right otitis media, unspecified otitis media type: acute illness or injury ? ?Amount and/or Complexity of Data Reviewed ?Labs: ordered. Decision-making details documented in ED Course. ?Radiology: ordered. Decision-making details documented in ED Course. ? ?Risk ?Prescription drug management. ? ? ? ? ? ? ? ?  ?Wilford Corner, MD ?03/25/21 1646 ? ?

## 2021-03-25 NOTE — Discharge Instructions (Addendum)
Take augmentin twice daily for a week for ear infection. See ENT for follow up  ? ?You have a small aneurysm, follow up with Dr. Sherlon Handing  ? ?See neurology for follow up  ? ?Return to ER if you have worse dizziness, trouble speaking, weakness  ?

## 2021-03-25 NOTE — ED Provider Notes (Signed)
1:53 PM ?Care of the patient assumed from outgoing team.  At that point the patient was awaiting MRI, which she is still scheduled for.  Delay is attributable to the patient previously receiving contrast dye, now needing time for that to clear prior to repeat scan.  Patient aware of this, comfortable, no new complaints ?  ?Gerhard Munch, MD ?03/25/21 1354 ? ?

## 2021-04-01 ENCOUNTER — Encounter: Payer: Self-pay | Admitting: Neurology

## 2021-04-01 ENCOUNTER — Ambulatory Visit (INDEPENDENT_AMBULATORY_CARE_PROVIDER_SITE_OTHER): Payer: Medicare Other | Admitting: Neurology

## 2021-04-01 VITALS — BP 101/64 | HR 76 | Ht 62.0 in | Wt 199.5 lb

## 2021-04-01 DIAGNOSIS — I729 Aneurysm of unspecified site: Secondary | ICD-10-CM | POA: Diagnosis not present

## 2021-04-01 DIAGNOSIS — R42 Dizziness and giddiness: Secondary | ICD-10-CM

## 2021-04-01 NOTE — Progress Notes (Signed)
We will  ? ?[facility] ? ?PATIENT: Lauren Cantu ?DOB: 01/26/1955 ? ?REQUESTING CLINICIAN: Rogers Blocker, MD ?HISTORY FROM: Patient  ?REASON FOR VISIT: Vertigo/ Found to have an incidental aneurysm  ? ? ?HISTORICAL ? ?CHIEF COMPLAINT:  ?Chief Complaint  ?Patient presents with  ? New Patient (Initial Visit)  ?  Pt in 12 pt is  here for hospital follow up with vertigo  Pt states she had a MRI and showed she had a brain aneurysm ?  ? ? ?HISTORY OF PRESENT ILLNESS:  ?This is a 66 year old woman with past medical history of hypertension hyperlipidemia, asthma, diabetes not on medication and history of vertigo who is presenting after being seen in the hospital on 3/10 for an episode of vertigo.  Patient's reported she was out having dinner when she suddenly experienced room spinning sensation, could not focus and she reports passing out.  She was taken to the ED, rule out stroke, initial work-up was negative for any acute stroke but she was found to have mastoiditis which was thought to be contributing to her vertigo.  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  ?Patient denies any history of headaches, she does not report any focal neurological deficit.  ?Since leaving the hospital, she denies any dizziness, no vertigo, denies any headaches, currently does not have any symptoms.  For her mastoiditis, she was treated with Augmentin. ? ?OTHER MEDICAL CONDITIONS: Diabetes, Hypertension, Hyperlipidemia, Asthma  ? ? ?REVIEW OF SYSTEMS: Full 14 system review of systems performed and negative with exception of: as noted in the HPI  ? ?ALLERGIES: ?Allergies  ?Allergen Reactions  ? Colchicine Itching  ? Lemon Eucalyptus (Citriodora) Oil   ? Orange Fruit [Citrus]   ? Pineapple   ? Sulfa Antibiotics Itching  ? Celebrex [Celecoxib] Rash  ? Zofran [Ondansetron] Hives  ? ? ?HOME MEDICATIONS: ?Outpatient Medications Prior to Visit  ?Medication Sig  Dispense Refill  ? acetaminophen-codeine (TYLENOL #3) 300-30 MG tablet Take 1 tablet by mouth 3 (three) times daily.    ? allopurinol (ZYLOPRIM) 300 MG tablet Take 300 mg by mouth daily.      ? alum & mag hydroxide-simeth (MAALOX/MYLANTA) 200-200-20 MG/5ML suspension Take 30 mLs by mouth as needed for indigestion or heartburn.    ? amLODipine (NORVASC) 10 MG tablet Take 10 mg by mouth daily.    ? amoxicillin-clavulanate (AUGMENTIN) 875-125 MG tablet Take 1 tablet by mouth every 12 (twelve) hours. 14 tablet 0  ? benazepril (LOTENSIN) 40 MG tablet Take 40 mg by mouth daily.    ? Calcium Carbonate-Vitamin D (CALTRATE 600+D PO) Take 2 tablets by mouth daily.      ? CALCIUM-MAGNESIUM-ZINC PO Take by mouth daily.      ? FLOVENT HFA 110 MCG/ACT inhaler Inhale 2 puffs into the lungs 2 (two) times daily.    ? gabapentin (NEURONTIN) 100 MG capsule Take 100 mg by mouth at bedtime.    ? lovastatin (MEVACOR) 20 MG tablet Take 20 mg by mouth daily.    ? meloxicam (MOBIC) 7.5 MG tablet Take 7.5 mg by mouth daily as needed for pain.    ? montelukast (SINGULAIR) 10 MG tablet Take 10 mg by mouth daily.      ? Multiple Vitamins-Minerals (CENTRUM SILVER PO) Take by mouth daily.      ? Na Sulfate-K Sulfate-Mg Sulf (SUPREP BOWEL PREP KIT) 17.5-3.13-1.6 GM/177ML SOLN Take 1 kit by mouth as  directed. For colonoscopy prep 354 mL 0  ? omeprazole (PRILOSEC) 40 MG capsule Take 1 capsule (40 mg total) by mouth 2 (two) times daily. 90 capsule 3  ? potassium chloride (KLOR-CON) 10 MEQ tablet Take 10 mEq by mouth 2 (two) times daily.    ? VENTOLIN HFA 108 (90 Base) MCG/ACT inhaler Inhale 1 puff into the lungs 4 (four) times daily as needed for cough, wheezing or shortness of breath.    ? furosemide (LASIX) 20 MG tablet Take 20 mg by mouth daily.    ? GLUCOSAMINE PO Take by mouth. OTC    ? metFORMIN (GLUCOPHAGE-XR) 500 MG 24 hr tablet Take 500 mg by mouth daily.    ? ?No facility-administered medications prior to visit.  ? ? ?PAST MEDICAL  HISTORY: ?Past Medical History:  ?Diagnosis Date  ? Arthritis   ? Essential hypertension, malignant   ? Extrinsic asthma, unspecified   ? GERD (gastroesophageal reflux disease)   ? Hyperlipidemia   ? Nonspecific elevation of levels of transaminase or lactic acid dehydrogenase (LDH)   ? Obesity, unspecified   ? Pain in joint, site unspecified   ? Type II or unspecified type diabetes mellitus without mention of complication, not stated as uncontrolled   ? ? ?PAST SURGICAL HISTORY: ?Past Surgical History:  ?Procedure Laterality Date  ? ABDOMINAL HYSTERECTOMY    ? ? ?FAMILY HISTORY: ?Family History  ?Problem Relation Age of Onset  ? Diabetes Mother   ? Heart disease Mother   ? Pancreatic cancer Father   ? Angelman syndrome Neg Hx   ? ? ?SOCIAL HISTORY: ?Social History  ? ?Socioeconomic History  ? Marital status: Single  ?  Spouse name: Not on file  ? Number of children: Not on file  ? Years of education: Not on file  ? Highest education level: Not on file  ?Occupational History  ? Not on file  ?Tobacco Use  ? Smoking status: Every Day  ?  Packs/day: 0.50  ?  Types: Cigarettes  ? Smokeless tobacco: Never  ?Substance and Sexual Activity  ? Alcohol use: No  ? Drug use: No  ? Sexual activity: Yes  ?  Birth control/protection: Surgical  ?Other Topics Concern  ? Not on file  ?Social History Narrative  ? Not on file  ? ?Social Determinants of Health  ? ?Financial Resource Strain: Not on file  ?Food Insecurity: Not on file  ?Transportation Needs: Not on file  ?Physical Activity: Not on file  ?Stress: Not on file  ?Social Connections: Not on file  ?Intimate Partner Violence: Not on file  ? ? ?PHYSICAL EXAM ? ?GENERAL EXAM/CONSTITUTIONAL: ?Vitals:  ?Vitals:  ? 04/01/21 1120  ?BP: 101/64  ?Pulse: 76  ?Weight: 199 lb 8 oz (90.5 kg)  ?Height: $RemoveB'5\' 2"'tVFdYMhl$  (1.575 m)  ? ?Body mass index is 36.49 kg/m?. ?Wt Readings from Last 3 Encounters:  ?04/01/21 199 lb 8 oz (90.5 kg)  ?03/24/21 197 lb (89.4 kg)  ?08/06/19 202 lb (91.6 kg)  ? ?Patient is  in no distress; well developed, nourished and groomed; neck is supple ? ?CARDIOVASCULAR: ?Examination of carotid arteries is normal; no carotid bruits ?Regular rate and rhythm, no murmurs ?Examination of peripheral vascular system by observation and palpation is normal ? ?EYES: ?Pupils round and reactive to light, Visual fields full to confrontation, Extraocular movements intacts,  ? ?MUSCULOSKELETAL: ?Gait, strength, tone, movements noted in Neurologic exam below ? ?NEUROLOGIC: ?MENTAL STATUS:  ?No flowsheet data found. ?awake, alert, oriented to person, place and  time ?recent and remote memory intact ?normal attention and concentration ?language fluent, comprehension intact, naming intact ?fund of knowledge appropriate ? ?CRANIAL NERVE:  ?2nd - no papilledema or hemorrhages on fundoscopic exam ?2nd, 3rd, 4th, 6th - pupils equal and reactive to light, visual fields full to confrontation, extraocular muscles intact, no nystagmus ?5th - facial sensation symmetric ?7th - facial strength symmetric ?8th - hearing intact ?9th - palate elevates symmetrically, uvula midline ?11th - shoulder shrug symmetric ?12th - tongue protrusion midline ? ?MOTOR:  ?normal bulk and tone, full strength in the BUE, BLE ? ?SENSORY:  ?normal and symmetric to light touch, pinprick, temperature, vibration ? ?COORDINATION:  ?finger-nose-finger, fine finger movements normal ? ?REFLEXES:  ?deep tendon reflexes present and symmetric ? ?GAIT/STATION:  ?normal ? ? ?DIAGNOSTIC DATA (LABS, IMAGING, TESTING) ?- I reviewed patient records, labs, notes, testing and imaging myself where available. ? ?Lab Results  ?Component Value Date  ? WBC 8.4 03/24/2021  ? HGB 14.2 03/24/2021  ? HCT 41.9 03/24/2021  ? MCV 82.3 03/24/2021  ? PLT 317 03/24/2021  ? ?   ?Component Value Date/Time  ? NA 141 03/24/2021 2033  ? NA 140 01/05/2012 0000  ? K 4.2 03/24/2021 2033  ? CL 111 03/24/2021 2033  ? CO2 20 (L) 03/24/2021 2015  ? GLUCOSE 123 (H) 03/24/2021 2033  ? BUN 19  03/24/2021 2033  ? BUN 15 01/05/2012 0000  ? CREATININE 0.90 03/24/2021 2033  ? CALCIUM 10.0 03/24/2021 2015  ? PROT 7.2 03/24/2021 2015  ? ALBUMIN 3.8 03/24/2021 2015  ? AST 30 03/24/2021 2015  ? ALT 19 03/24/2021 2

## 2021-04-01 NOTE — Patient Instructions (Addendum)
Continue current medications ?Referral to interventional radiologist for management of cerebral aneurysm ?Return in 1 year for follow-up ?

## 2021-04-04 ENCOUNTER — Telehealth: Payer: Self-pay | Admitting: Neurology

## 2021-04-04 NOTE — Telephone Encounter (Signed)
Referral sent to Dr. Debbrah Alar [phone]. ?

## 2021-04-05 ENCOUNTER — Other Ambulatory Visit (HOSPITAL_COMMUNITY): Payer: Self-pay | Admitting: Neuroradiology

## 2021-04-05 ENCOUNTER — Telehealth (HOSPITAL_COMMUNITY): Payer: Self-pay

## 2021-04-05 DIAGNOSIS — I671 Cerebral aneurysm, nonruptured: Secondary | ICD-10-CM

## 2021-04-05 NOTE — Telephone Encounter (Signed)
Called to schedule consult, no answer, left vm. AW  

## 2021-04-07 ENCOUNTER — Other Ambulatory Visit: Payer: Self-pay

## 2021-04-07 ENCOUNTER — Ambulatory Visit (HOSPITAL_COMMUNITY)
Admission: RE | Admit: 2021-04-07 | Discharge: 2021-04-07 | Disposition: A | Discharge status: Home / Self Care | Payer: Medicare Other | Source: Ambulatory Visit | Attending: Neuroradiology | Admitting: Neuroradiology

## 2021-04-07 DIAGNOSIS — I671 Cerebral aneurysm, nonruptured: Secondary | ICD-10-CM

## 2021-04-07 NOTE — Consult Note (Signed)
? ?Chief Complaint: Patient was seen in consultation today for brain aneurysm. ? ?Referring Physician(s): Camara, Amadou ? ?Supervising Physician: Pedro Earls ? ?Patient Status: [facility] - Out-pt ? ?History of Present Illness: ?Lauren Cantu is a 66 year old female with past medical history significant for hypertension hyperlipidemia, asthma, diabetes not on medication and vertigo.  She presented to the emergency room on 03/24/2021 complaining of sudden onset of vertigo, left arm weakness and slurred speech.  As part of workup for possible stroke she underwent a head CT, MRI of the brain and MRA of the head and neck.  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  She does smoke (30 pack year).  Her family history significant for paternal aunt with unruptured brain aneurysm.  ? ?MR angiogram of obtained known March 25, 2021 showed luminal irregularity of the carotid siphons, suggesting mild intracranial atherosclerotic disease without hemodynamically significant stenosis.  A 3 mm medially projecting outpouching from the cavernous segment of the left ICA is concerning for an aneurysm.  ? ? ?Past Medical History:  ?Diagnosis Date  ? Arthritis   ? Essential hypertension, malignant   ? Extrinsic asthma, unspecified   ? GERD (gastroesophageal reflux disease)   ? Hyperlipidemia   ? Nonspecific elevation of levels of transaminase or lactic acid dehydrogenase (LDH)   ? Obesity, unspecified   ? Pain in joint, site unspecified   ? Type II or unspecified type diabetes mellitus without mention of complication, not stated as uncontrolled   ? ? ?Past Surgical History:  ?Procedure Laterality Date  ? ABDOMINAL HYSTERECTOMY    ? ? ?Allergies: ?Colchicine, Lemon eucalyptus (citriodora) oil, Orange fruit [citrus], Pineapple, Sulfa antibiotics, Celebrex [celecoxib], and Zofran [ondansetron] ? ?Medications: ?Prior to Admission medications    ?Medication Sig Start Date End Date Taking? Authorizing Provider  ?acetaminophen-codeine (TYLENOL #3) 300-30 MG tablet Take 1 tablet by mouth 3 (three) times daily. 03/17/21   [provider]  ?allopurinol (ZYLOPRIM) 300 MG tablet Take 300 mg by mouth daily.      [provider]  ?alum & mag hydroxide-simeth (MAALOX/MYLANTA) 200-200-20 MG/5ML suspension Take 30 mLs by mouth as needed for indigestion or heartburn.    [provider]  ?amLODipine (NORVASC) 10 MG tablet Take 10 mg by mouth daily. 05/27/19   [provider]  ?amoxicillin-clavulanate (AUGMENTIN) 875-125 MG tablet Take 1 tablet by mouth every 12 (twelve) hours. 03/25/21   Drenda Freeze, MD  ?benazepril (LOTENSIN) 40 MG tablet Take 40 mg by mouth daily. 05/27/19   [provider]  ?Calcium Carbonate-Vitamin D (CALTRATE 600+D PO) Take 2 tablets by mouth daily.      [provider]  ?CALCIUM-MAGNESIUM-ZINC PO Take by mouth daily.      [provider]  ?FLOVENT HFA 110 MCG/ACT inhaler Inhale 2 puffs into the lungs 2 (two) times daily. 07/24/19   [provider]  ?furosemide (LASIX) 20 MG tablet Take 20 mg by mouth daily.    [provider]  ?gabapentin (NEURONTIN) 100 MG capsule Take 100 mg by mouth at bedtime.    [provider]  ?GLUCOSAMINE PO Take by mouth. OTC    [provider]  ?lovastatin (MEVACOR) 20 MG tablet Take 20 mg by mouth daily. 05/27/19   [provider]  ?meloxicam (MOBIC) 7.5 MG tablet Take 7.5 mg by mouth daily as needed for pain. 05/27/19   [provider]  ?metFORMIN (GLUCOPHAGE-XR)  500 MG 24 hr tablet Take 500 mg by mouth daily. 08/25/19   [provider]  ?montelukast (SINGULAIR) 10 MG tablet Take 10 mg by mouth daily.      [provider]  ?Multiple Vitamins-Minerals (CENTRUM SILVER PO) Take by mouth daily.      [provider]  ?Na Sulfate-K Sulfate-Mg Sulf (SUPREP BOWEL PREP KIT) 17.5-3.13-1.6  GM/177ML SOLN Take 1 kit by mouth as directed. For colonoscopy prep 08/06/19   Thornton Park, MD  ?omeprazole (PRILOSEC) 40 MG capsule Take 1 capsule (40 mg total) by mouth 2 (two) times daily. 08/06/19   Thornton Park, MD  ?potassium chloride (KLOR-CON) 10 MEQ tablet Take 10 mEq by mouth 2 (two) times daily. 05/27/19   [provider]  ?VENTOLIN HFA 108 (90 Base) MCG/ACT inhaler Inhale 1 puff into the lungs 4 (four) times daily as needed for cough, wheezing or shortness of breath. 06/09/19   [provider]  ?  ? ?Family History  ?Problem Relation Age of Onset  ? Diabetes Mother   ? Heart disease Mother   ? Pancreatic cancer Father   ? Angelman syndrome Neg Hx   ? ? ?Social History  ? ?Socioeconomic History  ? Marital status: Single  ?  Spouse name: Not on file  ? Number of children: Not on file  ? Years of education: Not on file  ? Highest education level: Not on file  ?Occupational History  ? Not on file  ?Tobacco Use  ? Smoking status: Every Day  ?  Packs/day: 0.50  ?  Types: Cigarettes  ? Smokeless tobacco: Never  ?Substance and Sexual Activity  ? Alcohol use: No  ? Drug use: No  ? Sexual activity: Yes  ?  Birth control/protection: Surgical  ?Other Topics Concern  ? Not on file  ?Social History Narrative  ? Not on file  ? ?Social Determinants of Health  ? ?Financial Resource Strain: Not on file  ?Food Insecurity: Not on file  ?Transportation Needs: Not on file  ?Physical Activity: Not on file  ?Stress: Not on file  ?Social Connections: Not on file  ? ? ? ?Review of Systems: A 12 point ROS discussed and pertinent positives are indicated in the HPI above.  All other systems are negative. ? ?Review of Systems ? ?Vital Signs: ?There were no vitals taken for this visit. ? ?Physical Exam ?Constitutional:   ?   Appearance: Normal appearance.  ?HENT:  ?   Head: Normocephalic.  ?   Mouth/Throat:  ?   Mouth: Mucous membranes are moist.  ?Eyes:  ?   Extraocular Movements: Extraocular movements  intact.  ?   Conjunctiva/sclera: Conjunctivae normal.  ?   Pupils: Pupils are equal, round, and reactive to light.  ?Neurological:  ?   Mental Status: She is alert and oriented to person, place, and time.  ?   Cranial Nerves: Cranial nerves 2-12 are intact.  ?   Sensory: Sensation is intact.  ?   Motor: Motor function is intact.  ?   Coordination: Coordination is intact.  ?   Gait: Gait is intact.  ? ? ? ?  ? ? ?Imaging: ?MR ANGIO HEAD WO CONTRAST ? ?Result Date: 03/25/2021 ?CLINICAL DATA:  Follow-up examination for acute stroke. EXAM: MRA NECK WITHOUT AND WITH CONTRAST MRA HEAD WITHOUT AND WITH CONTRAST TECHNIQUE: Multiplanar and multiecho pulse sequences of the neck were obtained without and with intravenous contrast. Angiographic images of the neck were obtained using MRA technique without  and with intravenous contast.; Angiographic images of the Circle of Willis were obtained using MRA technique without and with intravenous contrast. CONTRAST:  30mL GADAVIST GADOBUTROL 1 MMOL/ML IV SOLN COMPARISON:  Comparison made with prior head CT from earlier the same day. FINDINGS: MRA NECK FINDINGS AORTIC ARCH: Visualized aortic arch normal in caliber with normal branch pattern. No stenosis about the origin of the great vessels. RIGHT CAROTID SYSTEM: Right CCA patent from its origin to the bifurcation without stenosis. Mild to moderate atheromatous irregularity about the right carotid bulb/proximal right ICA without hemodynamically significant stenosis. Right ICA patent distally without stenosis or evidence for dissection. LEFT CAROTID SYSTEM: Left CCA patent from its origin to the bifurcation without stenosis. No significant atheromatous change or stenosis about the left carotid bulb. Left ICA patent distally without stenosis or dissection. VERTEBRAL ARTERIES: Both vertebral arteries arise from the subclavian arteries. No visible proximal subclavian artery stenosis. Right vertebral artery dominant, with a diffusely  hypoplastic left vertebral artery. Vertebral arteries tortuous but patent without focal stenosis or evidence for dissection. MRA HEAD FINDINGS ANTERIOR CIRCULATION: Visualized distal cervical segments of the internal carotid a

## 2021-04-28 ENCOUNTER — Emergency Department (HOSPITAL_COMMUNITY)
Admission: EM | Admit: 2021-04-28 | Discharge: 2021-04-28 | Disposition: A | Discharge status: Home / Self Care | Payer: Medicare Other | Attending: Emergency Medicine | Admitting: Emergency Medicine

## 2021-04-28 ENCOUNTER — Emergency Department (HOSPITAL_COMMUNITY): Payer: Medicare Other

## 2021-04-28 DIAGNOSIS — R0602 Shortness of breath: Secondary | ICD-10-CM | POA: Insufficient documentation

## 2021-04-28 DIAGNOSIS — Z79899 Other long term (current) drug therapy: Secondary | ICD-10-CM | POA: Insufficient documentation

## 2021-04-28 DIAGNOSIS — R2990 Unspecified symptoms and signs involving the nervous system: Secondary | ICD-10-CM

## 2021-04-28 DIAGNOSIS — R4701 Aphasia: Secondary | ICD-10-CM | POA: Insufficient documentation

## 2021-04-28 DIAGNOSIS — R7309 Other abnormal glucose: Secondary | ICD-10-CM | POA: Diagnosis not present

## 2021-04-28 DIAGNOSIS — I10 Essential (primary) hypertension: Secondary | ICD-10-CM | POA: Insufficient documentation

## 2021-04-28 DIAGNOSIS — R42 Dizziness and giddiness: Secondary | ICD-10-CM | POA: Diagnosis present

## 2021-04-28 DIAGNOSIS — R479 Unspecified speech disturbances: Secondary | ICD-10-CM

## 2021-04-28 DIAGNOSIS — F444 Conversion disorder with motor symptom or deficit: Secondary | ICD-10-CM

## 2021-04-28 LAB — CBC
HCT: 41.2 % (ref 36.0–46.0)
Hemoglobin: 14.5 g/dL (ref 12.0–15.0)
MCH: 28.7 pg (ref 26.0–34.0)
MCHC: 35.2 g/dL (ref 30.0–36.0)
MCV: 81.4 fL (ref 80.0–100.0)
Platelets: 263 10*3/uL (ref 150–400)
RBC: 5.06 MIL/uL (ref 3.87–5.11)
RDW: 14.8 % (ref 11.5–15.5)
WBC: 10 10*3/uL (ref 4.0–10.5)
nRBC: 0 % (ref 0.0–0.2)

## 2021-04-28 LAB — DIFFERENTIAL
Abs Immature Granulocytes: 0.04 10*3/uL (ref 0.00–0.07)
Basophils Absolute: 0.1 10*3/uL (ref 0.0–0.1)
Basophils Relative: 1 %
Eosinophils Absolute: 0.2 10*3/uL (ref 0.0–0.5)
Eosinophils Relative: 2 %
Immature Granulocytes: 0 %
Lymphocytes Relative: 48 %
Lymphs Abs: 4.8 10*3/uL — ABNORMAL HIGH (ref 0.7–4.0)
Monocytes Absolute: 0.8 10*3/uL (ref 0.1–1.0)
Monocytes Relative: 8 %
Neutro Abs: 4.1 10*3/uL (ref 1.7–7.7)
Neutrophils Relative %: 41 %

## 2021-04-28 LAB — COMPREHENSIVE METABOLIC PANEL
ALT: 23 U/L (ref 0–44)
AST: 33 U/L (ref 15–41)
Albumin: 3.9 g/dL (ref 3.5–5.0)
Alkaline Phosphatase: 116 U/L (ref 38–126)
Anion gap: 8 (ref 5–15)
BUN: 14 mg/dL (ref 8–23)
CO2: 22 mmol/L (ref 22–32)
Calcium: 9.8 mg/dL (ref 8.9–10.3)
Chloride: 110 mmol/L (ref 98–111)
Creatinine, Ser: 0.97 mg/dL (ref 0.44–1.00)
GFR, Estimated: >60 mL/min (ref >60)
Glucose, Bld: 111 mg/dL — ABNORMAL HIGH (ref 70–99)
Potassium: 4.3 mmol/L (ref 3.5–5.1)
Sodium: 140 mmol/L (ref 135–145)
Total Bilirubin: 0.2 mg/dL — ABNORMAL LOW (ref 0.3–1.2)
Total Protein: 7.5 g/dL (ref 6.5–8.1)

## 2021-04-28 LAB — APTT: aPTT: 33 seconds (ref 24–36)

## 2021-04-28 LAB — I-STAT CHEM 8, ED
BUN: 18 mg/dL (ref 8–23)
Calcium, Ion: 1.1 mmol/L — ABNORMAL LOW (ref 1.15–1.40)
Chloride: 110 mmol/L (ref 98–111)
Creatinine, Ser: 1 mg/dL (ref 0.44–1.00)
Glucose, Bld: 108 mg/dL — ABNORMAL HIGH (ref 70–99)
HCT: 44 % (ref 36.0–46.0)
Hemoglobin: 15 g/dL (ref 12.0–15.0)
Potassium: 4.6 mmol/L (ref 3.5–5.1)
Sodium: 142 mmol/L (ref 135–145)
TCO2: 25 mmol/L (ref 22–32)

## 2021-04-28 LAB — PROTIME-INR
INR: 1 (ref 0.8–1.2)
Prothrombin Time: 13.2 seconds (ref 11.4–15.2)

## 2021-04-28 LAB — CBG MONITORING, ED: Glucose-Capillary: 118 mg/dL — ABNORMAL HIGH (ref 70–99)

## 2021-04-28 MED ORDER — SODIUM CHLORIDE 0.9% FLUSH: 3.0000 mL | Freq: Once | INTRAVENOUS | Status: DC

## 2021-04-28 MED ORDER — IOHEXOL 350 MG/ML SOLN
75.0000 mL | Freq: Once | INTRAVENOUS | Status: AC | PRN
  Administered 2021-04-28: 75 mL via INTRAVENOUS

## 2021-04-28 NOTE — ED Provider Notes (Signed)
If MRI negative, D/C home neuro. ?Physical Exam  ?BP (!) 148/88 (BP Location: Right Arm)   Pulse 96   Temp 98.7 ?F (37.1 ?C)   Resp 16   Ht 5\' 2"  (1.575 m)   Wt 91.2 kg   SpO2 100%   BMI 36.77 kg/m?  ? ?Physical Exam ? ?Procedures  ?Procedures ? ?ED Course / MDM  ?  ?Medical Decision Making ?Amount and/or Complexity of Data Reviewed ?Labs: ordered. ?Radiology: ordered. ? ?MRI negative for acute findings. ? ?Patient updated on MRI results and family member at bedside. ? ?Patient also requested examination of her ears because last time she had similar symptoms she was told she had an ear infection.  Bilateral TMs examined.  Normal.  Ear canals are clear.  Patient speech and mental status are normal. ? ?Time spent educating patient on difference between middle ear infection, inner ear dysfunction and peripheral vertigo. ? ?We reviewed plan of follow-up with PCP within a week and with her neurologist as soon as possible. ? ? ? ? ? ?  ? , MD ?04/28/21 1017 ? ?

## 2021-04-28 NOTE — ED Notes (Signed)
Pt to MRI, alert, NAD, calm, interactive. No changes.  ?

## 2021-04-28 NOTE — ED Provider Notes (Signed)
?[facility] EMERGENCY DEPARTMENT ?Provider Note ? ? ?CSN: [id number] ?Arrival date & time: 04/28/21  0514 ? ?  ? ?History ? ?Chief Complaint  ?Patient presents with  ? Dizziness  ? Stroke Symptoms  ?  Pt woke up and started to get dizzy and noticed expressive aphasia.   ? ? ?Lauren Cantu is a 66 y.o. female. ? ?Patient is a 66 year old female with past medical history of hypertension hyperlipidemia, GERD.  Patient presenting today for evaluation of strokelike symptoms.  Patient got out of bed this morning then began to feel dizzy.  She described this as a lightheadedness and as if she was going to pass out.  She then describes feeling short of breath and hyperventilation, that she then developed difficulty speaking.  EMS was called and patient transported here as a code stroke.  Patient denies to me she is experiencing any weakness or numbness of the extremities.  She denies any headache or blurry vision. ? ?She was seen here 1 month ago with a similar presentation.  She had extensive work-up, all of which was unremarkable. ? ?The history is provided by the patient.  ?Dizziness ?Quality:  Lightheadedness ?Severity:  Moderate ?Onset quality:  Sudden ?Duration:  1 hour ?Timing:  Constant ?Progression:  Unchanged ?Chronicity:  New ?Relieved by:  Nothing ?Worsened by:  Nothing ?Ineffective treatments:  None tried ? ?  ? ?Home Medications ?Prior to Admission medications   ?Medication Sig Start Date End Date Taking? Authorizing Provider  ?allopurinol (ZYLOPRIM) 300 MG tablet Take 300 mg by mouth daily.     Yes [provider]  ?alum & mag hydroxide-simeth (MAALOX/MYLANTA) 200-200-20 MG/5ML suspension Take 30 mLs by mouth as needed for indigestion or heartburn.   Yes [provider]  ?amLODipine (NORVASC) 10 MG tablet Take 10 mg by mouth daily. 05/27/19  Yes [provider]  ?benazepril (LOTENSIN) 40 MG tablet Take 40 mg by mouth daily. 05/27/19  Yes [provider]   ?Calcium Carbonate-Vitamin D (CALTRATE 600+D PO) Take 2 tablets by mouth daily.     Yes [provider]  ?CALCIUM-MAGNESIUM-ZINC PO Take 1 tablet by mouth daily.   Yes [provider]  ?Fluocinolone Acetonide 0.01 % OIL Place 6 drops into both ears in the morning and at bedtime. 04/18/21  Yes [provider]  ?gabapentin (NEURONTIN) 100 MG capsule Take 100 mg by mouth daily.   Yes [provider]  ?lovastatin (MEVACOR) 20 MG tablet Take 20 mg by mouth daily. 05/27/19  Yes [provider]  ?meloxicam (MOBIC) 7.5 MG tablet Take 7.5 mg by mouth daily. 05/27/19  Yes [provider]  ?montelukast (SINGULAIR) 10 MG tablet Take 10 mg by mouth daily.     Yes [provider]  ?Multiple Vitamins-Minerals (CENTRUM SILVER PO) Take 1 tablet by mouth daily.   Yes [provider]  ?omeprazole (PRILOSEC) 40 MG capsule Take 1 capsule (40 mg total) by mouth 2 (two) times daily. ?Patient taking differently: Take 40 mg by mouth daily. 08/06/19  Yes Tressia Danas, MD  ?potassium chloride (KLOR-CON) 10 MEQ tablet Take 10 mEq by mouth 2 (two) times daily. 05/27/19  Yes [provider]  ?VENTOLIN HFA 108 (90 Base) MCG/ACT inhaler Inhale 1 puff into the lungs daily. 06/09/19  Yes [provider]  ?amoxicillin-clavulanate (AUGMENTIN) 875-125 MG tablet Take 1 tablet by mouth every 12 (twelve) hours. ?Patient not taking: Reported on 04/28/2021 03/25/21   Charlynne Pander, MD  ?   ? ?  Allergies    ?Colchicine, Lemon eucalyptus (citriodora) oil, Orange fruit [citrus], Pineapple, Sulfa antibiotics, Celebrex [celecoxib], and Zofran [ondansetron]   ? ?Review of Systems   ?Review of Systems  ?Neurological:  Positive for dizziness.  ?All other systems reviewed and are negative. ? ?Physical Exam ?Updated Vital Signs ?BP (!) 148/88 (BP Location: Right Arm)   Pulse 96   Temp 98.7 ?F (37.1 ?C)   Resp 16   Ht 5\' 2"  (1.575 m)   Wt 91.2 kg   SpO2 100%   BMI 36.77  kg/m?  ?Physical Exam ?Vitals and nursing note reviewed.  ?Constitutional:   ?   General: She is not in acute distress. ?   Appearance: She is well-developed. She is not diaphoretic.  ?HENT:  ?   Head: Normocephalic and atraumatic.  ?Cardiovascular:  ?   Rate and Rhythm: Normal rate and regular rhythm.  ?   Heart sounds: No murmur heard. ?  No friction rub. No gallop.  ?Pulmonary:  ?   Effort: Pulmonary effort is normal. No respiratory distress.  ?   Breath sounds: Normal breath sounds. No wheezing.  ?Abdominal:  ?   General: Bowel sounds are normal. There is no distension.  ?   Palpations: Abdomen is soft.  ?   Tenderness: There is no abdominal tenderness.  ?Musculoskeletal:     ?   General: Normal range of motion.  ?   Cervical back: Normal range of motion and neck supple.  ?Skin: ?   General: Skin is warm and dry.  ?Neurological:  ?   General: No focal deficit present.  ?   Mental Status: She is alert and oriented to person, place, and time.  ?   Cranial Nerves: No cranial nerve deficit.  ?   Sensory: No sensory deficit.  ?   Motor: No weakness.  ?   Coordination: Coordination normal.  ?   Comments: Patient is neurologically intact with the exception of the occasional difficulty forming words.  ? ? ?ED Results / Procedures / Treatments   ?Labs ?(all labs ordered are listed, but only abnormal results are displayed) ?Labs Reviewed  ?DIFFERENTIAL - Abnormal; Notable for the following components:  ?    Result Value  ? Lymphs Abs 4.8 (*)   ? All other components within normal limits  ?COMPREHENSIVE METABOLIC PANEL - Abnormal; Notable for the following components:  ? Glucose, Bld 111 (*)   ? Total Bilirubin 0.2 (*)   ? All other components within normal limits  ?I-STAT CHEM 8, ED - Abnormal; Notable for the following components:  ? Glucose, Bld 108 (*)   ? Calcium, Ion 1.10 (*)   ? All other components within normal limits  ?CBG MONITORING, ED - Abnormal; Notable for the following components:  ? Glucose-Capillary 118  (*)   ? All other components within normal limits  ?PROTIME-INR  ?APTT  ?CBC  ? ? ?EKG ?EKG Interpretation ? ?Date/Time:  Thursday April 28 2021 05:51:17 EDT ?Ventricular Rate:  86 ?PR Interval:  205 ?QRS Duration: 80 ?QT Interval:  357 ?QTC Calculation: 427 ?R Axis:   75 ?Text Interpretation: Sinus rhythm Normal ECG Confirmed by [id number] (Geoffery Lyons) on 04/28/2021 6:09:13 AM ? ?Radiology ?CT HEAD CODE STROKE WO CONTRAST ? ?Result Date: 04/28/2021 ?CLINICAL DATA:  Code stroke. 66 year old female with aphasia. Dizziness. EXAM: CT HEAD WITHOUT CONTRAST TECHNIQUE: Contiguous axial images were obtained from the base of the skull through the vertex without intravenous contrast. RADIATION DOSE REDUCTION: This exam was  performed according to the departmental dose-optimization program which includes automated exposure control, adjustment of the mA and/or kV according to patient size and/or use of iterative reconstruction technique. COMPARISON:  Head CT 03/24/2021.  Brain MRI 03/25/2021. FINDINGS: Brain: Partially empty sella. No midline shift, ventriculomegaly, mass effect, evidence of mass lesion, intracranial hemorrhage or evidence of cortically based acute infarction. Bilateral gray-white matter differentiation appears stable and within normal limits. Vascular: Calcified atherosclerosis at the skull base. Skull: No acute osseous abnormality identified. Sinuses/Orbits: Right mastoid effusion persists and appears increased. Negative noncontrast appearance of the nasopharynx. Tympanic cavities remain clear. And paranasal sinus aeration has improved from last month. Other: No acute orbit or scalp soft tissue finding. ASPECTS [facility]([place] Stroke Program Early CT Score) Total score (0-10 with 10 being normal): 10 IMPRESSION: 1. Stable and negative noncontrast CT appearance of the brain. ASPECTS 10. 2. #1 was communicated to Dr. Amada JupiterKirkpatrick at 5:42 am on 04/28/2021 by text page via the [facility]. 3. Increased right  mastoid effusion since last month. But improved paranasal sinus aeration. Electronically Signed   By: Odessa FlemingH  Hall M.D.   On: 04/28/2021 05:42  ? ?CT ANGIO HEAD NECK W WO CM (CODE STROKE) ? ?Result Date: 04/28/2021 ?[facility]

## 2021-04-28 NOTE — ED Notes (Signed)
Pt back from MRI. Alert, NAD, calm, interactive, participatory. Neuro at [facility].  ?

## 2021-04-28 NOTE — Consult Note (Signed)
Neurology Consultation ?Reason for Consult: Speech difficulty ?Referring Physician: Judd Lien, D ? ?CC: Speech difficulty ? ?History is obtained from: Patient ? ?HPI: Lauren Cantu is a 66 y.o. female with a history of diabetes, hyperlipidemia who presents with speech difficulty.  She states that she was in her normal state of health around 2:30 AM at which point she began having generalized weakness, then shortness of breath and some dizziness.  At that point she felt like she should "call somebody" and activated 911.  After EMS arrival, she began stuttering and therefore a code stroke was activated. ? ? ?LKW: 2:30 AM ?tpa given?: no, mild symptoms ?NIHSS: One ? ? ?ROS: A 14 point ROS was performed and is negative except as noted in the HPI.  ? ?Past Medical History:  ?Diagnosis Date  ? Arthritis   ? Essential hypertension, malignant   ? Extrinsic asthma, unspecified   ? GERD (gastroesophageal reflux disease)   ? Hyperlipidemia   ? Nonspecific elevation of levels of transaminase or lactic acid dehydrogenase (LDH)   ? Obesity, unspecified   ? Pain in joint, site unspecified   ? Type II or unspecified type diabetes mellitus without mention of complication, not stated as uncontrolled   ? ? ? ?Family History  ?Problem Relation Age of Onset  ? Diabetes Mother   ? Heart disease Mother   ? Pancreatic cancer Father   ? Angelman syndrome Neg Hx   ? ? ? ?Social History:  reports that she has been smoking cigarettes. She has been smoking an average of .5 packs per day. She has never used smokeless tobacco. She reports that she does not drink alcohol and does not use drugs. ? ? ?Exam: ?Current vital signs: ?BP (!) 115/57   Pulse 87   Temp 98.7 ?F (37.1 ?C)   Resp 19   Ht 5\' 2"  (1.575 m)   Wt 91.2 kg   SpO2 94%   BMI 36.77 kg/m?  ?Vital signs in last 24 hours: ?Temp:  [98.7 ?F (37.1 ?C)] 98.7 ?F (37.1 ?C) (04/13 0543) ?Pulse Rate:  [67-123] 87 (04/13 0645) ?Resp:  [16-20] 19 (04/13 0645) ?BP: (115-148)/(57-88) 115/57  (04/13 0645) ?SpO2:  [92 %-100 %] 94 % (04/13 0645) ?Weight:  [91.2 kg] 91.2 kg (04/13 0500) ? ? ?Physical Exam  ?Constitutional: Appears well-developed and well-nourished.  ?Psych: Affect appropriate to situation ?Eyes: No scleral injection ?HENT: No OP obstruction ?MSK: no joint deformities.  ?Cardiovascular: Normal rate and regular rhythm.  ?Respiratory: Effort normal, non-labored breathing ?GI: Soft.  No distension. There is no tenderness.  ?Skin: WDI ? ?Neuro: ?Mental Status: ?Patient is awake, alert, oriented to person, place, month, year, and situation. ?Patient is able to give a clear and coherent history. ?No signs of neglect ?She has mild word finding difficulty at times as well as some stuttering speech ?Cranial Nerves: ?II: Visual Fields are full. Pupils are equal, round, and reactive to light.   ?III,IV, VI: EOMI without ptosis or diploplia.  ?V: Facial sensation is symmetric to temperature ?VII: Facial movement is symmetric.  ?VIII: hearing is intact to voice ?X: Uvula elevates symmetrically ?XI: Shoulder shrug is symmetric. ?XII: tongue is midline without atrophy or fasciculations.  ?Motor: ?Tone is normal. Bulk is normal. 5/5 strength was present in all four extremities.  ?Sensory: ?Sensation is symmetric to light touch and temperature in the arms and legs. ?Deep Tendon Reflexes: ?2+ and symmetric in the biceps and patellae.  ?Cerebellar: ?FNF intact bilaterally ? ? ? ?I have  reviewed labs in epic and the results pertinent to this consultation are: ?Creatinine 0.97 ? ?I have reviewed the images obtained: CT/CTA-essentially negative ? ?Impression: 66 year old female with acute speech difficulty.  She had an identical episode 1 month ago where the symptoms lasted for couple of days, with negative MRI.  Given this coupled with the mild nature of her symptoms, I would not favor tenecteplase at this time.  If her MRI is negative, then I would favor treating this as functional neurological deficit, but if  positive she will need further work-up. ? ?Recommendations: ?1) MRI brain, further stroke work-up only if positive ? ? ?Ritta Slot, MD ?[facility] ?[phone] ? ?If 7pm- 7am, please page neurology on call as listed in [facility]. ? ?

## 2021-04-28 NOTE — Consult Note (Addendum)
NEUROLOGY CONSULTATION NOTE  ? ?Date of service: April 28, 2021 ?Patient Name: Lauren Cantu ?MRN:  [id number] ?DOB:  05-24-1955 ?Reason for consult: "stuttered speech" ?Requesting Provider: No att. providers found ? ?History of Present Illness  ?Lauren Cantu is a 66 y.o. female with PMH HTN, DM2, HLD, GERD presented (04/28/2021) for transient dizziness and stuttering speech. She presented for the same concerns about 1 month ago and extensive workup was negative.  ? ?04/28/2021 @ 4am, she was in bed laying on her right side, when she suddenly had sudden onset of dizziness. She tried to roll over and was unable for ~40mins. Reported associated SOB and palpitations. Afterwards, patient picked up her phone and called EMS which was when she noted her stuttering speech. She reported she has had stuttering speech in the past that comes and goes.  ?Prior to symptom onset, she went to bed at 2am after sister dropped her off from hanging out with grandchildren.  ?Denied medication changes, dose changes, new supplements, new diet, new life stressors.  ?She denied h/o stroke, MI, VTE.  ? ?No family at bedside.  ?  ?ROS  ? ?Constitutional Denies weight loss, fever and chills.   ?HEENT Denies changes in vision and hearing.   ?Respiratory SOB and cough.   ?CV Denies palpitations  ?GI Denies abdominal pain, nausea, vomiting and diarrhea.   ?Neurological Denies headache and syncope.   ? ?Past History  ? ?Past Medical History:  ?Diagnosis Date  ? Arthritis   ? Essential hypertension, malignant   ? Extrinsic asthma, unspecified   ? GERD (gastroesophageal reflux disease)   ? Hyperlipidemia   ? Nonspecific elevation of levels of transaminase or lactic acid dehydrogenase (LDH)   ? Obesity, unspecified   ? Pain in joint, site unspecified   ? Type II or unspecified type diabetes mellitus without mention of complication, not stated as uncontrolled   ? ?Past Surgical History:  ?Procedure Laterality Date  ? ABDOMINAL HYSTERECTOMY     ? ?Family History  ?Problem Relation Age of Onset  ? Diabetes Mother   ? Heart disease Mother   ? Pancreatic cancer Father   ? Angelman syndrome Neg Hx   ? ?Social History  ? ?Socioeconomic History  ? Marital status: Single  ?  Spouse name: Not on file  ? Number of children: Not on file  ? Years of education: Not on file  ? Highest education level: Not on file  ?Occupational History  ? Not on file  ?Tobacco Use  ? Smoking status: Every Day  ?  Packs/day: 0.50  ?  Types: Cigarettes  ? Smokeless tobacco: Never  ?Substance and Sexual Activity  ? Alcohol use: No  ? Drug use: No  ? Sexual activity: Yes  ?  Birth control/protection: Surgical  ?Other Topics Concern  ? Not on file  ?Social History Narrative  ? Not on file  ? ?Social Determinants of Health  ? ?Financial Resource Strain: Not on file  ?Food Insecurity: Not on file  ?Transportation Needs: Not on file  ?Physical Activity: Not on file  ?Stress: Not on file  ?Social Connections: Not on file  ? ?Allergies  ?Allergen Reactions  ? Colchicine Itching  ? Lemon Eucalyptus (Citriodora) Oil   ? Orange Fruit [Citrus]   ? Pineapple   ? Sulfa Antibiotics Itching  ? Celebrex [Celecoxib] Rash  ? Zofran [Ondansetron] Hives  ? ? ?Medications  ?(Not in a hospital admission) ?  ? ?Vitals  ? ?Vitals:  ?  04/28/21 0645 04/28/21 0700 04/28/21 0715 04/28/21 0800  ?BP: (!) 115/57 (!) 110/53 (!) 107/50 122/75  ?Pulse: 87 87 89 80  ?Resp: 19 20 (!) 25 19  ?Temp:      ?TempSrc:      ?SpO2: 94% 95% 91% 92%  ?Weight:      ?Height:      ?  ? ?Body mass index is 36.77 kg/m?. ? ?Physical Exam  ?General: Laying comfortably in bed; in no acute distress.  ?Pulmonary: Symmetric chest rise. Non-labored respiratory effort.  ? ?Neurologic Examination  ?Mental status/Cognition:  ?Alert, oriented to self, place, month and year,  good attention.  ?Speech/language:  ?Fluent, thought content appropriate. Mild stuttering towards end of conversation. ?Comprehension intact-able to follow 3 step commands  without difficulty.  ?Object naming intact, repetition intact.    ? ?Cranial Nerves: ?II: Pupils equal and reactive to light, no VF deficits  ?III,IV, VI: ptosis not present, extra-ocular motions intact bilaterally pupils equal, round, reactive to light and accommodation, no gaze preference or deviation, no nystagmus  ?V: facial light touch sensation normal bilaterally ?VII: smile symmetric, no asymmetry, no nasolabial fold flattening  ?VIII: hearing normal bilaterally ?IX,X: uvula rises symmetrically ?XI: bilateral shoulder shrug, head turn ?XII: midline tongue extension ?Motor: ?Right : Upper extremity   5/5    Left:     Upper extremity   5/5 ? Lower extremity   5/5     Lower extremity   5/5 ?Tone and bulk:normal tone throughout; no atrophy noted ? ?Sensory: Pinprick and light touch intact throughout bilaterally. ? ?Coordination/Complex Motor:  ?- Finger to Nose  ?- Rapid alternating movement  ?- Heel to shin  ?- Gait: deferred ? ?Labs  ? ?CBC:  ?Recent Labs  ?Lab 04/28/21 ?[id number] 04/28/21 ?[id number]  ?WBC 10.0  --   ?NEUTROABS 4.1  --   ?HGB 14.5 15.0  ?HCT 41.2 44.0  ?MCV 81.4  --   ?PLT 263  --   ? ? ?Basic Metabolic Panel:  ?Lab Results  ?Component Value Date  ? NA 142 04/28/2021  ? K 4.6 04/28/2021  ? CO2 22 04/28/2021  ? GLUCOSE 108 (H) 04/28/2021  ? BUN 18 04/28/2021  ? CREATININE 1.00 04/28/2021  ? CALCIUM 9.8 04/28/2021  ? GFRNONAA >60 04/28/2021  ? GFRAA 49 (L) 10/07/2019  ? ?Lipid Panel:  ?Lab Results  ?Component Value Date  ? LDLCALC 87 10/14/2010  ? ?HgbA1c:  ?Lab Results  ?Component Value Date  ? HGBA1C 6.2 (A) 01/05/2012  ? ?Urine Drug Screen:  ?   ?Component Value Date/Time  ? LABOPIA NONE DETECTED 03/24/2021 2050  ? COCAINSCRNUR NONE DETECTED 03/24/2021 2050  ? LABBENZ NONE DETECTED 03/24/2021 2050  ? AMPHETMU NONE DETECTED 03/24/2021 2050  ? THCU NONE DETECTED 03/24/2021 2050  ? LABBARB NONE DETECTED 03/24/2021 2050  ?  ?Alcohol Level No results found for: ETH ? ?CT Head without contrast(Personally  reviewed): 04/28/2021 ?Stable and negative noncontrast CT appearance of the brain. ASPECTS 10 ? ?CT angio Head and Neck with contrast(Personally reviewed): ?1. Negative for large vessel occlusion. But a paucity of Left MCA anterior division branches is suspicious for a left M3 branch occlusion. 2. Calcified carotid atherosclerosis in the neck and at the skull base with no hemodynamically significant stenosis. 3. Highly tortuous vertebral arteries without plaque or stenosis, the right vertebral is dominant. 4. No convincing Left ICA siphon aneurysm as questioned on MRA last month. 5. Aortic Atherosclerosis (ICD10-I70.0) ? ?MRI Brain(Personally reviewed): 04/28/2021 ?Same-day CT/CTA head  and neck FINDINGS: Brain: There is no acute intracranial hemorrhage, extra-axial fluid collection, or acute infarct Parenchymal volume is normal for age. The ventricles are normal in size. Gray-white differentiation is preserved. Scattered small foci of FLAIR signal abnormality in the subcortical and periventricular white matter likely reflects sequela of minimal chronic white matter microangiopathy. There is no suspicious parenchymal signal abnormality. There is no mass lesion. There is no mass effect or midline shift. ? ?Impression  ? ?TACHE BOBST is a 66 y.o. female with PMH HTN, DM2 (diet controlled), HLD, GERD presented (04/28/2021) for transient dizziness and stuttering speech. She presented for the same concerns about 1 month ago and extensive workup was negative.  ? ?No evidence of stroke on MRI. 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 Given patient has had major life changes within the last few years. ? ?Stroke risk factors:  ?DM2 is diet controlled. Already on statin. HTN well controlled.  ? ? Will sign off.  ? ? ?Recommendations  ?- No further work up required ?- Continue stroke risk factor modification ? ? ?Signed: ?Princess Bruins, DO ?Resident, PGY-1 ?[facility] ?04/28/2021, 2:52 PM   ? ?NEUROHOSPITALIST ADDENDUM ?Performed a face to face diagnostic evaluation.  ? ?I have reviewed the contents of history and physical exam as documented by PA/ARNP/Resident and agree with above documentation.  ?I

## 2021-04-28 NOTE — ED Notes (Addendum)
Alert, NAD, calm, interactive, resps e/u, speaking in clear complete sentences/ clearly. VSS. Reports "feel somewhat better". Denies pain, sob, weakness, nausea, numbness/tingling. Endorses occasional intermittent mild dizziness. Family at [facility].  ?

## 2021-04-28 NOTE — ED Notes (Signed)
MD at BS

## 2021-04-28 NOTE — Code Documentation (Signed)
Stroke Response Nurse Documentation ?Code Documentation ? ?Lauren Cantu is a 66 y.o. female arriving to [facility]  via [facility] on 4/13 with past medical hx of HTN, HLD, DM2. On No antithrombotic. Code stroke was activated by EMS.  ? ?Patient from home where she was LKW at 0230 and now complaining of expressive aphasia.  ? ?Stroke team at the bedside on patient arrival. Labs drawn and patient cleared for CT by Dr. Pilar Plate. Patient to CT with team. NIHSS 1, see documentation for details and code stroke times. Patient with Expressive aphasia  on exam. The following imaging was completed:  CT Head and CTA. Patient is not a candidate for IV Thrombolytic due to resolving symptoms. Patient is not not a candidate for IR due to No LVO.  ? ? ?Bedside handoff with ED RN [place].   ? ?Rose Fillers  ?Rapid Response RN ? ? ?

## 2021-05-01 ENCOUNTER — Emergency Department (HOSPITAL_COMMUNITY): Payer: Medicare Other

## 2021-05-01 ENCOUNTER — Other Ambulatory Visit: Payer: Self-pay

## 2021-05-01 ENCOUNTER — Observation Stay (HOSPITAL_COMMUNITY)
Admission: EM | Admit: 2021-05-01 | Discharge: 2021-05-02 | Disposition: A | Discharge status: Home / Self Care | Payer: Medicare Other | Attending: Internal Medicine | Admitting: Internal Medicine

## 2021-05-01 DIAGNOSIS — J45909 Unspecified asthma, uncomplicated: Secondary | ICD-10-CM | POA: Diagnosis not present

## 2021-05-01 DIAGNOSIS — R471 Dysarthria and anarthria: Secondary | ICD-10-CM | POA: Diagnosis not present

## 2021-05-01 DIAGNOSIS — E119 Type 2 diabetes mellitus without complications: Secondary | ICD-10-CM

## 2021-05-01 DIAGNOSIS — R531 Weakness: Secondary | ICD-10-CM | POA: Insufficient documentation

## 2021-05-01 DIAGNOSIS — K219 Gastro-esophageal reflux disease without esophagitis: Secondary | ICD-10-CM

## 2021-05-01 DIAGNOSIS — R479 Unspecified speech disturbances: Secondary | ICD-10-CM | POA: Insufficient documentation

## 2021-05-01 DIAGNOSIS — M1990 Unspecified osteoarthritis, unspecified site: Secondary | ICD-10-CM | POA: Diagnosis not present

## 2021-05-01 DIAGNOSIS — I671 Cerebral aneurysm, nonruptured: Secondary | ICD-10-CM | POA: Insufficient documentation

## 2021-05-01 DIAGNOSIS — R42 Dizziness and giddiness: Principal | ICD-10-CM

## 2021-05-01 DIAGNOSIS — R4701 Aphasia: Secondary | ICD-10-CM | POA: Insufficient documentation

## 2021-05-01 DIAGNOSIS — R29818 Other symptoms and signs involving the nervous system: Secondary | ICD-10-CM | POA: Insufficient documentation

## 2021-05-01 DIAGNOSIS — I10 Essential (primary) hypertension: Secondary | ICD-10-CM | POA: Diagnosis not present

## 2021-05-01 DIAGNOSIS — Z7952 Long term (current) use of systemic steroids: Secondary | ICD-10-CM | POA: Insufficient documentation

## 2021-05-01 DIAGNOSIS — F17210 Nicotine dependence, cigarettes, uncomplicated: Secondary | ICD-10-CM | POA: Insufficient documentation

## 2021-05-01 DIAGNOSIS — Z79899 Other long term (current) drug therapy: Secondary | ICD-10-CM | POA: Insufficient documentation

## 2021-05-01 DIAGNOSIS — E785 Hyperlipidemia, unspecified: Secondary | ICD-10-CM | POA: Diagnosis not present

## 2021-05-01 DIAGNOSIS — Z833 Family history of diabetes mellitus: Secondary | ICD-10-CM | POA: Diagnosis not present

## 2021-05-01 DIAGNOSIS — R4689 Other symptoms and signs involving appearance and behavior: Secondary | ICD-10-CM

## 2021-05-01 DIAGNOSIS — E872 Acidosis, unspecified: Secondary | ICD-10-CM | POA: Diagnosis not present

## 2021-05-01 DIAGNOSIS — E1140 Type 2 diabetes mellitus with diabetic neuropathy, unspecified: Secondary | ICD-10-CM | POA: Insufficient documentation

## 2021-05-01 DIAGNOSIS — H7011 Chronic mastoiditis, right ear: Secondary | ICD-10-CM | POA: Insufficient documentation

## 2021-05-01 DIAGNOSIS — Z6836 Body mass index (BMI) 36.0-36.9, adult: Secondary | ICD-10-CM | POA: Diagnosis not present

## 2021-05-01 DIAGNOSIS — Z8249 Family history of ischemic heart disease and other diseases of the circulatory system: Secondary | ICD-10-CM | POA: Diagnosis not present

## 2021-05-01 DIAGNOSIS — E669 Obesity, unspecified: Secondary | ICD-10-CM | POA: Diagnosis not present

## 2021-05-01 DIAGNOSIS — M109 Gout, unspecified: Secondary | ICD-10-CM | POA: Insufficient documentation

## 2021-05-01 LAB — CBC
HCT: 41.4 % (ref 36.0–46.0)
Hemoglobin: 14.4 g/dL (ref 12.0–15.0)
MCH: 28.2 pg (ref 26.0–34.0)
MCHC: 34.8 g/dL (ref 30.0–36.0)
MCV: 81 fL (ref 80.0–100.0)
Platelets: 280 10*3/uL (ref 150–400)
RBC: 5.11 MIL/uL (ref 3.87–5.11)
RDW: 14.6 % (ref 11.5–15.5)
WBC: 7.3 10*3/uL (ref 4.0–10.5)
nRBC: 0 % (ref 0.0–0.2)

## 2021-05-01 LAB — COMPREHENSIVE METABOLIC PANEL
ALT: 24 U/L (ref 0–44)
AST: 37 U/L (ref 15–41)
Albumin: 3.8 g/dL (ref 3.5–5.0)
Alkaline Phosphatase: 102 U/L (ref 38–126)
Anion gap: 11 (ref 5–15)
BUN: 11 mg/dL (ref 8–23)
CO2: 20 mmol/L — ABNORMAL LOW (ref 22–32)
Calcium: 9.9 mg/dL (ref 8.9–10.3)
Chloride: 107 mmol/L (ref 98–111)
Creatinine, Ser: 0.92 mg/dL (ref 0.44–1.00)
GFR, Estimated: >60 mL/min (ref >60)
Glucose, Bld: 100 mg/dL — ABNORMAL HIGH (ref 70–99)
Potassium: 4 mmol/L (ref 3.5–5.1)
Sodium: 138 mmol/L (ref 135–145)
Total Bilirubin: 0.7 mg/dL (ref 0.3–1.2)
Total Protein: 7.4 g/dL (ref 6.5–8.1)

## 2021-05-01 LAB — DIFFERENTIAL
Abs Immature Granulocytes: 0.02 10*3/uL (ref 0.00–0.07)
Basophils Absolute: 0 10*3/uL (ref 0.0–0.1)
Basophils Relative: 1 %
Eosinophils Absolute: 0.2 10*3/uL (ref 0.0–0.5)
Eosinophils Relative: 2 %
Immature Granulocytes: 0 %
Lymphocytes Relative: 45 %
Lymphs Abs: 3.3 10*3/uL (ref 0.7–4.0)
Monocytes Absolute: 0.4 10*3/uL (ref 0.1–1.0)
Monocytes Relative: 6 %
Neutro Abs: 3.3 10*3/uL (ref 1.7–7.7)
Neutrophils Relative %: 46 %

## 2021-05-01 LAB — URINALYSIS, ROUTINE W REFLEX MICROSCOPIC
Bacteria, UA: NONE SEEN
Bilirubin Urine: NEGATIVE
Glucose, UA: NEGATIVE mg/dL
Ketones, ur: NEGATIVE mg/dL
Leukocytes,Ua: NEGATIVE
Nitrite: NEGATIVE
Protein, ur: NEGATIVE mg/dL
Specific Gravity, Urine: 1.004 — ABNORMAL LOW (ref 1.005–1.030)
pH: 7 (ref 5.0–8.0)

## 2021-05-01 LAB — RAPID URINE DRUG SCREEN, HOSP PERFORMED
Amphetamines: NOT DETECTED
Barbiturates: NOT DETECTED
Benzodiazepines: NOT DETECTED
Cocaine: NOT DETECTED
Opiates: NOT DETECTED
Tetrahydrocannabinol: NOT DETECTED

## 2021-05-01 LAB — APTT: aPTT: 32 seconds (ref 24–36)

## 2021-05-01 LAB — PROTIME-INR
INR: 1.1 (ref 0.8–1.2)
Prothrombin Time: 13.7 seconds (ref 11.4–15.2)

## 2021-05-01 LAB — ETHANOL: Alcohol, Ethyl (B): <10 mg/dL (ref <10)

## 2021-05-01 MED ORDER — MECLIZINE HCL 12.5 MG PO TABS: 12.5000 mg | ORAL_TABLET | Freq: Three times a day (TID) | ORAL | 0 refills | Status: AC | PRN

## 2021-05-01 MED ORDER — ALBUTEROL SULFATE (2.5 MG/3ML) 0.083% IN NEBU: 3.0000 mL | INHALATION_SOLUTION | RESPIRATORY_TRACT | Status: DC | PRN

## 2021-05-01 MED ORDER — ALLOPURINOL 100 MG PO TABS
300.0000 mg | ORAL_TABLET | Freq: Every day | ORAL | Status: DC
  Administered 2021-05-02: 300 mg via ORAL
  Filled 2021-05-01: qty 3

## 2021-05-01 MED ORDER — MECLIZINE HCL 25 MG PO TABS
12.5000 mg | ORAL_TABLET | Freq: Once | ORAL | Status: AC
Start: 2021-05-01 — End: 2021-05-01
  Administered 2021-05-01: 12.5 mg via ORAL
  Filled 2021-05-01: qty 1

## 2021-05-01 MED ORDER — GABAPENTIN 100 MG PO CAPS
100.0000 mg | ORAL_CAPSULE | Freq: Every day | ORAL | Status: DC
  Administered 2021-05-02: 100 mg via ORAL
  Filled 2021-05-01: qty 1

## 2021-05-01 MED ORDER — PREDNISONE 20 MG PO TABS
60.0000 mg | ORAL_TABLET | Freq: Once | ORAL | Status: AC
  Administered 2021-05-01: 60 mg via ORAL
  Filled 2021-05-01: qty 3

## 2021-05-01 MED ORDER — ACETAMINOPHEN 650 MG RE SUPP: 650.0000 mg | Freq: Four times a day (QID) | RECTAL | Status: DC | PRN

## 2021-05-01 MED ORDER — ACETAMINOPHEN 325 MG PO TABS: 650.0000 mg | ORAL_TABLET | Freq: Four times a day (QID) | ORAL | Status: DC | PRN

## 2021-05-01 MED ORDER — ENOXAPARIN SODIUM 40 MG/0.4ML IJ SOSY
40.0000 mg | PREFILLED_SYRINGE | INTRAMUSCULAR | Status: DC
  Filled 2021-05-01: qty 0.4

## 2021-05-01 MED ORDER — PREDNISONE 20 MG PO TABS: 40.0000 mg | ORAL_TABLET | Freq: Every day | ORAL | 0 refills | Status: DC

## 2021-05-01 MED ORDER — DIAZEPAM 5 MG/ML IJ SOLN
2.5000 mg | Freq: Once | INTRAMUSCULAR | Status: AC
  Administered 2021-05-01: 2.5 mg via INTRAVENOUS
  Filled 2021-05-01: qty 2

## 2021-05-01 MED ORDER — ALBUTEROL SULFATE (2.5 MG/3ML) 0.083% IN NEBU: 2.5000 mg | INHALATION_SOLUTION | Freq: Four times a day (QID) | RESPIRATORY_TRACT | Status: DC | PRN

## 2021-05-01 MED ORDER — MONTELUKAST SODIUM 10 MG PO TABS
10.0000 mg | ORAL_TABLET | Freq: Every day | ORAL | Status: DC
  Administered 2021-05-02: 10 mg via ORAL
  Filled 2021-05-01: qty 1

## 2021-05-01 MED ORDER — PANTOPRAZOLE SODIUM 40 MG PO TBEC
40.0000 mg | DELAYED_RELEASE_TABLET | Freq: Every day | ORAL | Status: DC
  Administered 2021-05-02: 40 mg via ORAL
  Filled 2021-05-01: qty 1

## 2021-05-01 MED ORDER — SODIUM CHLORIDE 0.9 % IV SOLN: INTRAVENOUS | Status: AC

## 2021-05-01 NOTE — ED Notes (Signed)
Pt sat up to get dressed, felt dizzy and laid back down. MD notified. ?

## 2021-05-01 NOTE — Progress Notes (Signed)
EEG COMPLETE, PENDING RESULTS. ?

## 2021-05-01 NOTE — ED Provider Notes (Addendum)
Patient is a 66 year old female who is checked out to me by Dr. Doren Custard for return due to difficulty getting her words out and slowed speech that she has been having intermittently now for several months.  She was seen 2 days ago and had CTA of the head neck and MRI which were all negative.  She was seen by neurology who felt that this was most likely psychogenic but at this time they were not worried about stroke.  Patient however reports that last night she had another episode and involved right arm weakness as well.  It did self resolved but then she had the speech issue return today.  She has no evidence of weakness or acute neurologic findings except for it appears very difficult for her to speak and she talks very slowly.  Discussed with the neurologist who happened to be the same neurologist who saw her 2 days ago.  He felt this was most likely not a stroke but did recommend doing another MRI and we will also do a stat EEG to ensure that this is not a typical seizure.  Patient does have risk factors of hypertension, hyperlipidemia and diabetes.  She has not had stroke in the past.  She was given 2.5 mg of Valium.  I independently interpreted patient's labs today and they are normal with normal UA, UDS, CBC, CMP and coags.  I independently visualized and interpreted patient's head CT which showed no evidence of acute intercranial bleed and radiology reported it was normal. ? ?7:48 PM ?Patient's EEG was normal per neurologist Dr. Quinn Axe.  Her MRI today showed no evidence of acute abnormality except for a opacified right mastoid.  When speaking with the patient it sounds like she has dizziness prior to developing the sensation that her body is paralyzed usually more involving the right side and then the speech issue start.  This has been intermittent since early March.  She has seen an ENT and a neurologist.  However when looking through the ENTs note they reported she was there for itchy ear and her dizziness did  not appear to be addressed.  Patient does take Singulair and does have issues with allergies in her sinuses.  She did attempt to use nasal sprays but reported it would cause her to have severe facial pain so she does not use those.  On exam patient no longer has any dizziness but she still does have occasional's pauses when she is speaking.  She does not appear to require admission today.  We will try a prednisone 5-day course to see if that helps with her symptoms in case this might be vestibulitis.  Patient is going to try to follow-up with her PCP tomorrow which I encouraged her to do so.  Findings were discussed with she and her daughter. ? ?8:38 PM ?Pt was initially able to get up and go to the backroom but then started to spin again and noted right sided weakness and then she had more speech struggles.  Pt is unable to ambulate at this time.   ?  ?Blanchie Dessert, MD ?05/01/21 1953 ? ?  ?Blanchie Dessert, MD ?05/01/21 2251 ? ?

## 2021-05-01 NOTE — ED Notes (Signed)
EEG tech at bedside. 

## 2021-05-01 NOTE — Procedures (Signed)
Routine EEG Report ? ?Lauren Cantu is a 66 y.o. female with a history of spell of abnormal behavior who is undergoing an EEG to evaluate for seizures. ? ?Report: This EEG was acquired with electrodes placed according to the International 10-20 electrode system (including Fp1, Fp2, F3, F4, C3, C4, P3, P4, O1, O2, T3, T4, T5, T6, A1, A2, Fz, Cz, Pz). The following electrodes were missing or displaced: none. ? ?The occipital dominant rhythm was 9 Hz. This activity is reactive to stimulation. Drowsiness was manifested by background fragmentation; deeper stages of sleep were not identified. There was no focal slowing. There were no interictal epileptiform discharges. There were no electrographic seizures identified. There was no abnormal response to photic stimulation or hyperventilation.  ? ?Impression: This EEG was obtained while awake and drowsy and is normal. ?   ?Clinical Correlation: Normal EEGs, however, do not rule out epilepsy. ? ?Bing Neighbors, MD ?[facility] ?[phone] ? ?If 7pm- 7am, please page neurology on call as listed in AMION. ? ?

## 2021-05-01 NOTE — H&P (Signed)
?History and Physical  ? ? Lauren Cantu BJS:[id number] DOB: 1955-02-04 DOA: 05/01/2021 ? ?PCP: Gwenyth Bender, MD ? ?Patient coming from: Home ? ?Chief Complaint: Dizziness ? ?HPI: Lauren Cantu is a 66 y.o. female with medical history significant of hypertension, gout, GERD, asthma, hyperlipidemia, type 2 diabetes, history of vertigo.  Recently seen in the ED on 3/10 for vertigo and MRI was negative for acute stroke but showed evidence of mastoiditis.  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  It was felt that mastoiditis could be contributing to her symptoms and she was prescribed Augmentin.  Had a follow-up visit at neurology office on 3/17 but was not having any symptoms at that time.  Seen in the ED again on 4/13 for transient dizziness and stuttering speech.  Brain MRI repeated and was negative for acute stroke.  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  She returns to the ED today with complaints of dizziness, slurred speech, and right-sided weakness.  No weakness or acute neurologic findings on exam except her speech was slow with occasional pauses.  Neurology reconsulted and recommended repeating brain MRI and obtaining EEG.  EEG done and was normal.  MRI again negative for acute abnormality except for opacification of right mastoid.  It was felt that her symptoms might possibly be due to vestibular neuritis and she was given a dose of prednisone.  She was also given Valium and meclizine.  Patient was initially able to get up and go to the bathroom but then became symptomatic again with vertigo, speech issues, and complained of right-sided weakness.  ED physician spoke to Dr. Amada Jupiter who will consult.  Her vital signs remained stable.  CBC unremarkable.  CMP notable for bicarb 20 with normal anion gap.  UDS negative.  Blood ethanol level undetectable. ? ?Patient states she was seen in the emergency room a few days ago for dizziness and after  going home initially felt well but yesterday started having dizziness again.  She describes it as room spinning sensation followed by feeling of "fullness" in her head and difficulty with speech.  Also reports right-sided weakness since yesterday.  Denies any ringing sensation in her ears.  Reports history of vertigo.  No other complaints.  Denies fevers, cough, shortness of breath, chest pain, nausea, vomiting, abdominal pain, or diarrhea. ? ?Review of Systems:  ?Review of Systems  ?All other systems reviewed and are negative. ? ?Past Medical History:  ?Diagnosis Date  ? Arthritis   ? Essential hypertension, malignant   ? Extrinsic asthma, unspecified   ? GERD (gastroesophageal reflux disease)   ? Hyperlipidemia   ? Nonspecific elevation of levels of transaminase or lactic acid dehydrogenase (LDH)   ? Obesity, unspecified   ? Pain in joint, site unspecified   ? Type II or unspecified type diabetes mellitus without mention of complication, not stated as uncontrolled   ? ? ?Past Surgical History:  ?Procedure Laterality Date  ? ABDOMINAL HYSTERECTOMY    ? ? ? reports that she has been smoking cigarettes. She has been smoking an average of .5 packs per day. She has never used smokeless tobacco. She reports that she does not drink alcohol and does not use drugs. ? ?Allergies  ?Allergen Reactions  ? Colchicine Itching  ? Lemon Eucalyptus (Citriodora) Oil   ? Orange Fruit [Citrus]   ? Pineapple   ? Sulfa Antibiotics Itching  ? Celebrex [Celecoxib] Rash  ? Zofran [Ondansetron] Hives  ? ? ?  Family History  ?Problem Relation Age of Onset  ? Diabetes Mother   ? Heart disease Mother   ? Pancreatic cancer Father   ? Angelman syndrome Neg Hx   ? ? ?Prior to Admission medications   ?Medication Sig Start Date End Date Taking? Authorizing Provider  ?meclizine (ANTIVERT) 12.5 MG tablet Take 1 tablet (12.5 mg total) by mouth 3 (three) times daily as needed for dizziness. 05/01/21  Yes Gwyneth Sprout, MD  ?predniSONE (DELTASONE) 20  MG tablet Take 2 tablets (40 mg total) by mouth daily. 05/01/21  Yes Gwyneth Sprout, MD  ?allopurinol (ZYLOPRIM) 300 MG tablet Take 300 mg by mouth daily.      [provider]  ?alum & mag hydroxide-simeth (MAALOX/MYLANTA) 200-200-20 MG/5ML suspension Take 30 mLs by mouth as needed for indigestion or heartburn.    [provider]  ?amLODipine (NORVASC) 10 MG tablet Take 10 mg by mouth daily. 05/27/19   [provider]  ?amoxicillin-clavulanate (AUGMENTIN) 875-125 MG tablet Take 1 tablet by mouth every 12 (twelve) hours. ?Patient not taking: Reported on 04/28/2021 03/25/21   Charlynne Pander, MD  ?benazepril (LOTENSIN) 40 MG tablet Take 40 mg by mouth daily. 05/27/19   [provider]  ?Calcium Carbonate-Vitamin D (CALTRATE 600+D PO) Take 2 tablets by mouth daily.      [provider]  ?CALCIUM-MAGNESIUM-ZINC PO Take 1 tablet by mouth daily.    [provider]  ?Fluocinolone Acetonide 0.01 % OIL Place 6 drops into both ears in the morning and at bedtime. 04/18/21   [provider]  ?gabapentin (NEURONTIN) 100 MG capsule Take 100 mg by mouth daily.    [provider]  ?lovastatin (MEVACOR) 20 MG tablet Take 20 mg by mouth daily. 05/27/19   [provider]  ?meloxicam (MOBIC) 7.5 MG tablet Take 7.5 mg by mouth daily. 05/27/19   [provider]  ?montelukast (SINGULAIR) 10 MG tablet Take 10 mg by mouth daily.      [provider]  ?Multiple Vitamins-Minerals (CENTRUM SILVER PO) Take 1 tablet by mouth daily.    [provider]  ?omeprazole (PRILOSEC) 40 MG capsule Take 1 capsule (40 mg total) by mouth 2 (two) times daily. ?Patient taking differently: Take 40 mg by mouth daily. 08/06/19   Tressia Danas, MD  ?potassium chloride (KLOR-CON) 10 MEQ tablet Take 10 mEq by mouth 2 (two) times daily. 05/27/19   [provider]  ?VENTOLIN HFA 108 (90 Base) MCG/ACT inhaler Inhale 1 puff into the lungs daily. 06/09/19    [provider]  ? ? ?Physical Exam: ?Vitals:  ? 05/01/21 1902 05/01/21 2005 05/01/21 2130 05/01/21 2200  ?BP:  (!) 117/59 (!) 142/81 125/79  ?Pulse: 81 80 73 79  ?Resp: 11 17 16  (!) 25  ?Temp:      ?TempSrc:      ?SpO2: 93% 93% 97% 95%  ?Weight:      ?Height:      ? ? ?Physical Exam ?Vitals reviewed.  ?Constitutional:   ?   General: She is not in acute distress. ?HENT:  ?   Head: Normocephalic and atraumatic.  ?Eyes:  ?   Extraocular Movements: Extraocular movements intact.  ?   Conjunctiva/sclera: Conjunctivae normal.  ?Cardiovascular:  ?   Rate and Rhythm: Normal rate and regular rhythm.  ?   Pulses: Normal pulses.  ?Pulmonary:  ?   Effort: Pulmonary effort is normal. No respiratory distress.  ?   Breath sounds: Normal breath sounds. No wheezing  or rales.  ?Abdominal:  ?   General: Bowel sounds are normal. There is no distension.  ?   Palpations: Abdomen is soft.  ?   Tenderness: There is no abdominal tenderness.  ?Musculoskeletal:     ?   General: No swelling or tenderness.  ?   Cervical back: Normal range of motion and neck supple.  ?Skin: ?   General: Skin is warm and dry.  ?Neurological:  ?   General: No focal deficit present.  ?   Mental Status: She is alert and oriented to person, place, and time.  ?   Cranial Nerves: No cranial nerve deficit.  ?   Sensory: No sensory deficit.  ?   Motor: No weakness.  ?   Comments: Speech fluent except having very brief intermittent episodes where her speech will slow down for a few seconds  ?  ? ?Labs on Admission: I have personally reviewed following labs and imaging studies ? ?CBC: ?Recent Labs  ?Lab 04/28/21 ?[id number] 04/28/21 ?[id number] 05/01/21 ?1525  ?WBC 10.0  --  7.3  ?NEUTROABS 4.1  --  3.3  ?HGB 14.5 15.0 14.4  ?HCT 41.2 44.0 41.4  ?MCV 81.4  --  81.0  ?PLT 263  --  280  ? ?Basic Metabolic Panel: ?Recent Labs  ?Lab 04/28/21 ?[id number] 04/28/21 ?[id number] 05/01/21 ?1525  ?NA 140 142 138  ?K 4.3 4.6 4.0  ?CL 110 110 107  ?CO2 22  --  20*  ?GLUCOSE 111* 108* 100*  ?BUN 14 18  11   ?CREATININE 0.97 1.00 0.92  ?CALCIUM 9.8  --  9.9  ? ?GFR: ?Estimated Creatinine Clearance: 62.3 mL/min (by C-G formula based on SCr of 0.92 mg/dL). ?Liver Function Tests: ?Recent Labs  ?Lab 04/28/21

## 2021-05-01 NOTE — Discharge Instructions (Signed)
You were given a dose of prednisone here and then a prescription was sent to your pharmacy that you will take daily for the next 5 days.  Hopefully this might help with the inflammation in your mastoid which might be causing your dizziness.  If you have another episode try taking the meclizine and giving it time to see if that improves your symptoms.  However if you have worsening symptoms and you still cannot move your right side stand or walk and its not getting better return to the emergency room ?

## 2021-05-01 NOTE — ED Triage Notes (Signed)
Pt arrived by EMS from home complaining of slurred speech and trouble pronouncing words.  ? ?Pt has been seen multiple times for stroke like symptoms over the past 2 months and has been diagnosed with a brain aneurysm.  ? ?Pt states that last night she was feeling dizzy and had right sided weakness with supper that resolved after about 30 minutes.  ?

## 2021-05-01 NOTE — ED Provider Notes (Signed)
?[facility] EMERGENCY DEPARTMENT ?Provider Note ? ? ?CSN: [id number] ?Arrival date & time: 05/01/21  1425 ? ?  ? ?History ? ?Chief Complaint  ?Patient presents with  ? Aphasia  ? ? ?Lauren Cantu is a 66 y.o. female. ? ?HPI ?Patient presents for strokelike symptoms.  Her medical history includes HTN, asthma, GERD, HLD, arthritis, DM, obesity.  She was seen in the emergency department 3 days ago for dizziness and expressive aphasia.  She was noted to have some difficulty forming words on arrival.  She arrived as a code stroke.  She underwent imaging of brain, including MRI.  MRI, at that time, was negative.  She was discharged home from the ED.  Yesterday she reportedly had recurrence of difficulty with speech in addition to right arm weakness.  This resolved after 30 minutes.  Today she has had no other recurrence with difficulty with speech.  Patient currently denies any other symptoms.  She denies any current dizziness.  She denies any current pain. ?  ? ?Home Medications ?Prior to Admission medications   ?Medication Sig Start Date End Date Taking? Authorizing Provider  ?acetaminophen (TYLENOL) 500 MG tablet Take 500 mg by mouth every 6 (six) hours as needed for moderate pain or headache.   Yes [provider]  ?allopurinol (ZYLOPRIM) 300 MG tablet Take 300 mg by mouth daily.     Yes [provider]  ?alum & mag hydroxide-simeth (MAALOX/MYLANTA) 200-200-20 MG/5ML suspension Take 30 mLs by mouth as needed for indigestion or heartburn.   Yes [provider]  ?amLODipine (NORVASC) 10 MG tablet Take 10 mg by mouth daily. 05/27/19  Yes [provider]  ?benazepril (LOTENSIN) 40 MG tablet Take 40 mg by mouth daily. 05/27/19  Yes [provider]  ?Calcium Carbonate-Vitamin D (CALTRATE 600+D PO) Take 2 tablets by mouth daily.     Yes [provider]  ?CALCIUM-MAGNESIUM-ZINC PO Take 1 tablet by mouth daily.   Yes [provider]  ?Fluocinolone  Acetonide 0.01 % OIL Place 6 drops into both ears in the morning and at bedtime. 04/18/21  Yes [provider]  ?gabapentin (NEURONTIN) 100 MG capsule Take 100 mg by mouth daily.   Yes [provider]  ?lovastatin (MEVACOR) 20 MG tablet Take 20 mg by mouth daily. 05/27/19  Yes [provider]  ?meclizine (ANTIVERT) 12.5 MG tablet Take 1 tablet (12.5 mg total) by mouth 3 (three) times daily as needed for dizziness. 05/01/21  Yes Gwyneth Sprout, MD  ?meloxicam (MOBIC) 7.5 MG tablet Take 7.5 mg by mouth daily. 05/27/19  Yes [provider]  ?montelukast (SINGULAIR) 10 MG tablet Take 10 mg by mouth daily.     Yes [provider]  ?Multiple Vitamins-Minerals (CENTRUM SILVER PO) Take 1 tablet by mouth daily.   Yes [provider]  ?omeprazole (PRILOSEC) 40 MG capsule Take 1 capsule (40 mg total) by mouth 2 (two) times daily. ?Patient taking differently: Take 40 mg by mouth daily. 08/06/19  Yes Tressia Danas, MD  ?potassium chloride (KLOR-CON) 10 MEQ tablet Take 10 mEq by mouth 2 (two) times daily. 05/27/19  Yes [provider]  ?predniSONE (DELTASONE) 20 MG tablet Take 2 tablets (40 mg total) by mouth daily. 05/01/21  Yes Gwyneth Sprout, MD  ?VENTOLIN HFA 108 (90 Base) MCG/ACT inhaler Inhale 1 puff into the lungs every 4 (four) hours as needed for wheezing or shortness of breath. 06/09/19  Yes [provider]  ?amoxicillin-clavulanate (AUGMENTIN) 875-125 MG tablet  Take 1 tablet by mouth every 12 (twelve) hours. ?Patient not taking: Reported on 04/28/2021 03/25/21   Charlynne PanderYao, David Hsienta, MD  ?   ? ?Allergies    ?Colchicine, Lemon eucalyptus (citriodora) oil, Orange fruit [citrus], Pineapple, Sulfa antibiotics, Celebrex [celecoxib], and Zofran [ondansetron]   ? ?Review of Systems   ?Review of Systems  ?Neurological:  Positive for speech difficulty.  ?All other systems reviewed and are negative. ? ?Physical Exam ?Updated Vital Signs ?BP 137/90   Pulse 88    Temp 98 ?F (36.7 ?C) (Oral)   Resp 20   Ht 5\' 2"  (1.575 m)   Wt 91.2 kg   SpO2 99%   BMI 36.77 kg/m?  ?Physical Exam ?Vitals and nursing note reviewed.  ?Constitutional:   ?   General: She is not in acute distress. ?   Appearance: Normal appearance. She is well-developed. She is not ill-appearing, toxic-appearing or diaphoretic.  ?HENT:  ?   Head: Normocephalic and atraumatic.  ?   Right Ear: External ear normal.  ?   Left Ear: External ear normal.  ?   Nose: Nose normal.  ?   Mouth/Throat:  ?   Mouth: Mucous membranes are moist.  ?   Pharynx: Oropharynx is clear.  ?Eyes:  ?   Extraocular Movements: Extraocular movements intact.  ?   Conjunctiva/sclera: Conjunctivae normal.  ?Cardiovascular:  ?   Rate and Rhythm: Normal rate and regular rhythm.  ?   Heart sounds: No murmur heard. ?Pulmonary:  ?   Effort: Pulmonary effort is normal. No respiratory distress.  ?   Breath sounds: Normal breath sounds. No wheezing or rales.  ?Chest:  ?   Chest wall: No tenderness.  ?Abdominal:  ?   Palpations: Abdomen is soft.  ?   Tenderness: There is no abdominal tenderness.  ?Musculoskeletal:     ?   General: No swelling. Normal range of motion.  ?   Cervical back: Normal range of motion and neck supple.  ?   Right lower leg: No edema.  ?   Left lower leg: No edema.  ?Skin: ?   General: Skin is warm and dry.  ?   Coloration: Skin is not jaundiced or pale.  ?Neurological:  ?   Mental Status: She is alert and oriented to person, place, and time.  ?   Cranial Nerves: Dysarthria present. No cranial nerve deficit or facial asymmetry.  ?   Sensory: Sensation is intact. No sensory deficit.  ?   Motor: Motor function is intact. No weakness, abnormal muscle tone or pronator drift.  ?   Coordination: Coordination is intact. Finger-Nose-Finger Test normal.  ?Psychiatric:     ?   Mood and Affect: Mood normal.  ? ? ?ED Results / Procedures / Treatments   ?Labs ?(all labs ordered are listed, but only abnormal results are displayed) ?Labs  Reviewed  ?COMPREHENSIVE METABOLIC PANEL - Abnormal; Notable for the following components:  ?    Result Value  ? CO2 20 (*)   ? Glucose, Bld 100 (*)   ? All other components within normal limits  ?URINALYSIS, ROUTINE W REFLEX MICROSCOPIC - Abnormal; Notable for the following components:  ? Color, Urine STRAW (*)   ? Specific Gravity, Urine 1.004 (*)   ? Hgb urine dipstick SMALL (*)   ? All other components within normal limits  ?HEMOGLOBIN A1C - Abnormal; Notable for the following components:  ? Hgb A1c MFr Bld 5.9 (*)   ? All other components within  normal limits  ?ETHANOL  ?PROTIME-INR  ?APTT  ?CBC  ?DIFFERENTIAL  ?RAPID URINE DRUG SCREEN, HOSP PERFORMED  ?HIV ANTIBODY (ROUTINE TESTING W REFLEX)  ?BASIC METABOLIC PANEL  ? ? ?EKG ?EKG Interpretation ? ?Date/Time:  Sunday May 01 2021 14:38:21 EDT ?Ventricular Rate:  89 ?PR Interval:  193 ?QRS Duration: 74 ?QT Interval:  348 ?QTC Calculation: 424 ?R Axis:   67 ?Text Interpretation: Sinus rhythm Confirmed by Gloris Manchester [phone]) on 05/01/2021 3:12:24 PM ? ?Radiology ?CT HEAD WO CONTRAST ? ?Result Date: 05/01/2021 ?CLINICAL DATA:  Neuro deficit EXAM: CT HEAD WITHOUT CONTRAST TECHNIQUE: Contiguous axial images were obtained from the base of the skull through the vertex without intravenous contrast. RADIATION DOSE REDUCTION: This exam was performed according to the departmental dose-optimization program which includes automated exposure control, adjustment of the mA and/or kV according to patient size and/or use of iterative reconstruction technique. COMPARISON:  CT head 04/28/2021 FINDINGS: Brain: No acute intracranial hemorrhage, mass effect, or herniation. No extra-axial fluid collections. No evidence of acute territorial infarct. No hydrocephalus. Mild cortical volume loss. Vascular: Calcified plaques in the carotid siphons. Skull: Normal. Negative for fracture or focal lesion. Sinuses/Orbits: Near-complete opacification of the right mastoid air cells similar to  previous. Other: None. IMPRESSION: 1. No acute intracranial process identified. 2. Stable near complete opacification of the right mastoid air cells. Electronically Signed   By: Jannifer Hick M.D.   On: 04/16/202

## 2021-05-02 ENCOUNTER — Encounter (HOSPITAL_COMMUNITY): Payer: Self-pay | Admitting: Internal Medicine

## 2021-05-02 DIAGNOSIS — K219 Gastro-esophageal reflux disease without esophagitis: Secondary | ICD-10-CM | POA: Diagnosis not present

## 2021-05-02 DIAGNOSIS — E872 Acidosis, unspecified: Secondary | ICD-10-CM | POA: Diagnosis not present

## 2021-05-02 DIAGNOSIS — J45909 Unspecified asthma, uncomplicated: Secondary | ICD-10-CM

## 2021-05-02 DIAGNOSIS — I10 Essential (primary) hypertension: Secondary | ICD-10-CM | POA: Diagnosis not present

## 2021-05-02 DIAGNOSIS — E119 Type 2 diabetes mellitus without complications: Secondary | ICD-10-CM

## 2021-05-02 DIAGNOSIS — R42 Dizziness and giddiness: Secondary | ICD-10-CM | POA: Diagnosis not present

## 2021-05-02 LAB — HEMOGLOBIN A1C
Hgb A1c MFr Bld: 5.9 % — ABNORMAL HIGH (ref 4.8–5.6)
Mean Plasma Glucose: 122.63 mg/dL

## 2021-05-02 LAB — HIV ANTIBODY (ROUTINE TESTING W REFLEX): HIV Screen 4th Generation wRfx: NONREACTIVE

## 2021-05-02 NOTE — Consult Note (Addendum)
Neurology Consultation ?Reason for Consult: Episodes of dizziness ?Referring Physician: Donald Prose ? ?CC: Dizziness ? ?History is obtained from: Patient ? ?HPI: Lauren Cantu is a 66 y.o. female who has come in for multiple episodes of dizziness with speech abnormality over the past few days to a month.  She has had two MRIs, CTA and an MRI all of which have no explanations for her symptoms.  She denies frank headache with any of these episodes, but does occasionally have some pressure inside her head which lasts just as long as the dizziness.  She occasionally will have pressure behind her left eye as well.  She denies any history of migraines, but did used to get fairly severe headaches associated with her cycle. ? ?She denies any photophobia with these episodes. ? ?She describes the dizziness as a sensation of movement, her head spinning in the room.  Though she gives this description, when I asked her if it feels similar to " standing up too fast and having the blood rush from your head" she states that it is very similar. ? ? ?ROS: A 14 point ROS was performed and is negative except as noted in the HPI.  ?Past Medical History:  ?Diagnosis Date  ? Arthritis   ? Essential hypertension, malignant   ? Extrinsic asthma, unspecified   ? GERD (gastroesophageal reflux disease)   ? Hyperlipidemia   ? Nonspecific elevation of levels of transaminase or lactic acid dehydrogenase (LDH)   ? Obesity, unspecified   ? Pain in joint, site unspecified   ? Type II or unspecified type diabetes mellitus without mention of complication, not stated as uncontrolled   ? ? ? ?Family History  ?Problem Relation Age of Onset  ? Diabetes Mother   ? Heart disease Mother   ? Pancreatic cancer Father   ? Angelman syndrome Neg Hx   ? ? ? ?Social History:  reports that she has been smoking cigarettes. She has been smoking an average of .5 packs per day. She has never used smokeless tobacco. She reports that she does not drink alcohol and does  not use drugs. ? ? ?Exam: ?Current vital signs: ?BP 132/75   Pulse 82   Temp 98 ?F (36.7 ?C) (Oral)   Resp (!) 21   Ht 5\' 2"  (1.575 m)   Wt 91.2 kg   SpO2 96%   BMI 36.77 kg/m?  ?Vital signs in last 24 hours: ?Temp:  [98 ?F (36.7 ?C)] 98 ?F (36.7 ?C) (04/16 1436) ?Pulse Rate:  [73-89] 82 (04/16 2300) ?Resp:  [11-25] 21 (04/16 2300) ?BP: (110-142)/(59-88) 132/75 (04/16 2300) ?SpO2:  [93 %-99 %] 96 % (04/16 2300) ?Weight:  [91.2 kg] 91.2 kg (04/16 1437) ? ? ?Physical Exam  ?Constitutional: Appears well-developed and well-nourished.  ?Psych: Affect appropriate to situation ?Eyes: No scleral injection ?HENT: No OP obstruction ?MSK: no joint deformities.  ?Cardiovascular: Normal rate and regular rhythm.  ?Respiratory: Effort normal, non-labored breathing ?GI: Soft.  No distension. There is no tenderness.  ?Skin: WDI ? ?Neuro: ?Mental Status: ?Patient is awake, alert, oriented to person, place, month, year, and situation. ?Patient is able to give a clear and coherent history. ?No signs of aphasia or neglect.  She has mild stuttering at times. ?Cranial Nerves: ?II: Visual Fields are full. Pupils are equal, round, and reactive to light.   ?III,IV, VI: EOMI without ptosis or diploplia.  ?V: Facial sensation is symmetric to temperature ?VII: Facial movement is symmetric.  ?VIII: hearing is intact to  voice ?X: Uvula elevates symmetrically ?XI: Shoulder shrug is symmetric. ?XII: tongue is midline without atrophy or fasciculations.  ?Motor: ?Tone is normal. Bulk is normal. 5/5 strength was present in all four extremities.  ?Sensory: ?Sensation is symmetric to light touch and temperature in the arms and legs. ?Deep Tendon Reflexes: ?2+ and symmetric in the biceps and patellae.  ?Plantars: ?Toes are downgoing bilaterally.  ?Cerebellar: ?FNF and HKS are intact bilaterally ? ? ?I have reviewed labs in epic and the results pertinent to this consultation are: ?CMP-unremarkable ? ?I have reviewed the images obtained: CTA - no  explanation for symptoms ?MRI brain - negative ? ?Impression: 66 yo F with recurrent episodes of dizziness and stuttering speech. I was able to hear a larger sampling of her stuttering during my previous evaluation on 04/13 and felt it was most likely non-organic. IT is possible her dizziness is related to presyncope and I will check orthostatic vital signs. With negativ eEEG, I think seizure is unlikely.  ? ?Recommendations: ?1) Orthostatic vital signs ?2) Treatment of AKI/other medical issue per IM.  ?3) vestibular PT evaluation ? ?Roland Rack, MD ?[facility] ?[phone] ? ?If 7pm- 7am, please page neurology on call as listed in [place]. ? ?

## 2021-05-02 NOTE — Discharge Summary (Signed)
? ?Physician Discharge Summary  ?Lauren Cantu OZD:[id number] DOB: 1955/02/27 DOA: 05/01/2021 ? ?PCP: Gwenyth Bender, MD ? ?Admit date: 05/01/2021 ?Discharge date: 05/02/2021 ? ?Admitted From: Home ? ?Discharge disposition: Home ? ? ?Recommendations for Outpatient Follow-Up:  ? ?Follow up with your primary care provider in one week.  ?Patient will benefit from ENT evaluation for vestibular etiology work-up for vertigo dizziness and possible psychiatry consultation/stress management. ? ?Discharge Diagnosis:  ? ?Principal Problem: ?  Vertigo ?Active Problems: ?  Normal anion gap metabolic acidosis ?  Essential hypertension ?  GERD (gastroesophageal reflux disease) ?  Asthma, chronic ?  Type 2 diabetes mellitus (HCC) ? ? ?Discharge Condition: Improved. ? ?Diet recommendation: Low sodium, heart healthy.  Carbohydrate-modified.   ?Wound care: None. ? ?Code status: Full. ? ? ?History of Present Illness:  ? ?Lauren Cantu is a 66 y.o. female with medical history significant of hypertension, gout, GERD, asthma, hyperlipidemia, type 2 diabetes, history of vertigo was recent recently seen in the ED on 03/25/2021 for vertigo.  At that time MRI was negative for acute stroke but showed mastoiditis.  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  Patient was given Augmentin and subsequently was asked to follow-up with neurology on 317.  She again presented on 413 for transient dizziness and stuttering speech.  MRI of the brain was repeated which was negative for stroke.  This time again she presents with dizziness slurred speech and right-sided weakness neurology was consulted from the ED the inpatient was placed in for observation.  ? ?Hospital Course:  ? ?Following conditions were addressed during hospitalization as listed below, ? ?Vertigo, speech difficulty, subjective right-sided weakness ?Patient with 3 MRIs in the last month which were negative.  Neurology was consulted.  Patient was recently treated for  mastoiditis which did not help.  Patient was asked to follow-up with the ENT for vestibular assessment as outpatient.  There is possibility of psychogenic component to her symptoms.  Spoke with the patient's daughter at bedside regarding this.  Advised to follow-up with psychiatry/counseling and ENT as outpatient.patient did not have orthostatic hypotension.  Meclizine and prednisone have been prescribed at this time. ? ?mild normal anion gap metabolic acidosis ?On presentation.  Improved with IV hydration. ? ?Hypertension ?Orthostatic  vitals were negative.  Resume home medications. ?  ?GERD ?Controlled.  On PPI. ?  ?Asthma ?On Singulair and albuterol.  No signs of exacerbation. ?  ?Type 2 diabetes, diet controlled ?Hemoglobin A1c was 5.9. ?  ?Neuropathy ?-Continue gabapentin ?  ?Gout ?-Continue allopurinol ? ?Disposition.  At this time, patient is stable for disposition home with outpatient PCP and ENT follow-up.  Spoke with patient's daughter prior to disposition. ? ?Medical Consultants:  ? ?None. ? ?Procedures: ?  ? ?None ?Subjective:  ? ?Today, patient examined at bedside.  Denies vertigo.  Denies chest pain shortness of breath dizziness lightheadedness.  Weakness has improved ? ?Discharge Exam:  ? ?Vitals:  ? 05/02/21 1000 05/02/21 1249  ?BP: 137/90 126/78  ?Pulse: 88 98  ?Resp: 20 15  ?Temp:  98.3 ?F (36.8 ?C)  ?SpO2: 99% 98%  ? ?Vitals:  ? 05/02/21 0744 05/02/21 0900 05/02/21 1000 05/02/21 1249  ?BP:  119/70 137/90 126/78  ?Pulse:  86 88 98  ?Resp:  15 20 15   ?Temp:    98.3 ?F (36.8 ?C)  ?TempSrc:    Oral  ?SpO2: 96% 98% 99% 98%  ?Weight:      ?Height:      ? ?  Body mass index is 36.77 kg/m?. ?General: Alert awake, not in obvious distress, obese built ?HENT: pupils equally reacting to light,  No scleral pallor or icterus noted. Oral mucosa is moist.  ?Chest:  Clear breath sounds.  Diminished breath sounds bilaterally. No crackles or wheezes.  ?CVS: S1 &S2 heard. No murmur.  Regular rate and rhythm. ?Abdomen:  Soft, nontender, nondistended.  Bowel sounds are heard.   ?Extremities: No cyanosis, clubbing or edema.  Peripheral pulses are palpable. ?Psych: Alert, awake and oriented, mildly tearful at times. ?CNS:  No cranial nerve deficits.  Power equal in all extremities.   ?Skin: Warm and dry.  No rashes noted. ? ?The results of significant diagnostics from this hospitalization (including imaging, microbiology, ancillary and laboratory) are listed below for reference.   ? ? ?Diagnostic Studies:  ? ?CT HEAD WO CONTRAST ? ?Result Date: 05/01/2021 ?CLINICAL DATA:  Neuro deficit EXAM: CT HEAD WITHOUT CONTRAST TECHNIQUE: Contiguous axial images were obtained from the base of the skull through the vertex without intravenous contrast. RADIATION DOSE REDUCTION: This exam was performed according to the departmental dose-optimization program which includes automated exposure control, adjustment of the mA and/or kV according to patient size and/or use of iterative reconstruction technique. COMPARISON:  CT head 04/28/2021 FINDINGS: Brain: No acute intracranial hemorrhage, mass effect, or herniation. No extra-axial fluid collections. No evidence of acute territorial infarct. No hydrocephalus. Mild cortical volume loss. Vascular: Calcified plaques in the carotid siphons. Skull: Normal. Negative for fracture or focal lesion. Sinuses/Orbits: Near-complete opacification of the right mastoid air cells similar to previous. Other: None. IMPRESSION: 1. No acute intracranial process identified. 2. Stable near complete opacification of the right mastoid air cells. Electronically Signed   By: Jannifer Hick M.D.   On: 05/01/2021 15:18  ? ?MR BRAIN WO CONTRAST ? ?Result Date: 05/01/2021 ?CLINICAL DATA:  Neuro deficit, acute, stroke suspected. Acute dizziness. Abnormal speech. Expressive aphasia. Similar episode 3 days ago. EXAM: MRI HEAD WITHOUT CONTRAST TECHNIQUE: Multiplanar, multiecho pulse sequences of the brain and surrounding structures were  obtained without intravenous contrast. COMPARISON:  CT head without contrast 05/01/2021. MR head without contrast 04/28/2021 FINDINGS: Brain: No acute infarct, hemorrhage, or mass lesion is present. Mild atrophy and white matter changes are stable, within normal limits for age. The ventricles are of normal size. No significant extraaxial fluid collection is present. The internal auditory canals are within normal limits. The brainstem and cerebellum are within normal limits. Vascular: Flow is present in the major intracranial arteries. Skull and upper cervical spine: The craniocervical junction is normal. Upper cervical spine is within normal limits. Marrow signal is unremarkable. Sinuses/Orbits: Chronic right mastoid effusion again noted. No obstructing nasopharyngeal lesion. The paranasal sinuses and mastoid air cells are otherwise clear. The globes and orbits are within normal limits. IMPRESSION: 1. Normal MRI appearance of the brain for age. 2. Chronic right mastoid effusion. No obstructing nasopharyngeal lesion is present. Electronically Signed   By: Marin Roberts M.D.   On: 05/01/2021 17:45  ? ?EEG adult ? ?Result Date: 05/01/2021 ?Jefferson Fuel, MD     05/01/2021  7:00 PM Routine EEG Report Lauren Cantu is a 66 y.o. female with a history of spell of abnormal behavior who is undergoing an EEG to evaluate for seizures. Report: This EEG was acquired with electrodes placed according to the International 10-20 electrode system (including Fp1, Fp2, F3, F4, C3, C4, P3, P4, O1, O2, T3, T4, T5, T6, A1, A2, Fz, Cz, Pz). The following electrodes were  missing or displaced: none. The occipital dominant rhythm was 9 Hz. This activity is reactive to stimulation. Drowsiness was manifested by background fragmentation; deeper stages of sleep were not identified. There was no focal slowing. There were no interictal epileptiform discharges. There were no electrographic seizures identified. There was no abnormal  response to photic stimulation or hyperventilation. Impression: This EEG was obtained while awake and drowsy and is normal.   Clinical Correlation: Normal EEGs, however, do not rule out epilepsy. Bing Neighborsolleen Stack, MD

## 2021-05-02 NOTE — ED Notes (Signed)
Pt care assumed. Pt noted to be in bed, resting. Easily aroused. Pt denies any needs at this time.  ?

## 2021-06-01 ENCOUNTER — Ambulatory Visit: Payer: Medicare Other | Attending: Neurology | Admitting: Physical Therapy

## 2021-06-01 VITALS — BP 114/69 | HR 93

## 2021-06-01 DIAGNOSIS — R2681 Unsteadiness on feet: Secondary | ICD-10-CM | POA: Diagnosis present

## 2021-06-01 DIAGNOSIS — R42 Dizziness and giddiness: Secondary | ICD-10-CM | POA: Insufficient documentation

## 2021-06-01 NOTE — Therapy (Signed)
?OUTPATIENT PHYSICAL THERAPY VESTIBULAR EVALUATION ? ? ? ? ?Patient Name: Lauren Cantu ?MRN: [id number] ?DOB:[birth year], 66 y.o., female ?Today's Date: 06/01/2021 ? ?PCP: Gwenyth Bender, MD ?REFERRING PROVIDER: Rema Jasmine, MD  ? ? PT End of Session - 06/01/21 1533   ? ? Visit Number 1   ? Number of Visits 7   ? Date for PT Re-Evaluation 08/30/21   due to potential delay in scheduling  ? Authorization Type [employer] Medicare   ? PT Start Time 1448   ? PT Stop Time 1530   ? PT Time Calculation (min) 42 min   ? Activity Tolerance Patient tolerated treatment well   ? Behavior During Therapy [facility] for tasks assessed/performed   ? ?  ?  ? ?  ? ? ?Past Medical History:  ?Diagnosis Date  ? Arthritis   ? Essential hypertension, malignant   ? Extrinsic asthma, unspecified   ? GERD (gastroesophageal reflux disease)   ? Hyperlipidemia   ? Nonspecific elevation of levels of transaminase or lactic acid dehydrogenase (LDH)   ? Obesity, unspecified   ? Pain in joint, site unspecified   ? Type II or unspecified type diabetes mellitus without mention of complication, not stated as uncontrolled   ? ?Past Surgical History:  ?Procedure Laterality Date  ? ABDOMINAL HYSTERECTOMY    ? ?Patient Active Problem List  ? Diagnosis Date Noted  ? Vertigo 05/01/2021  ? Normal anion gap metabolic acidosis 05/01/2021  ? Essential hypertension 05/01/2021  ? GERD (gastroesophageal reflux disease) 05/01/2021  ? Asthma, chronic 05/01/2021  ? Type 2 diabetes mellitus (HCC) 05/01/2021  ? ? ?ONSET DATE: 05/26/2021 (date of referral) ? ?REFERRING DIAG: R42 (ICD-10-CM) - Dizziness  ? ?THERAPY DIAG:  ?Unsteadiness on feet ? ?Dizziness and giddiness ? ?SUBJECTIVE:  ? ?SUBJECTIVE STATEMENT: ?Will go to see an ENT on June 1st. Will get the spinning sensation whenever. Will all of a sudden get lightheaded and feels like she is spinning. Head will feel like there is a lot of pressure in it and sometimes the room will spin. The room will only spin for a second  or two, or sometimes it will go back to back to back. Takes meclizine everyday. If she doesn't, has to hold on to things to move around the house. Has 6-7 episodes of spinning a day, even on the meclizine. Has not had any falls. If she moves over too quickly in bed, can notice some spinning. No nausea. Sometimes she will just be sitting and watching TV and the room/her head will start spinning and then she finds herself leaning over with those episodes. Notices some of her episodes are worse when she is at the sink doing dishes.  ? ?Pt accompanied by: self ? ?PERTINENT HISTORY: Lauren Cantu is a 66 y.o. female with medical history significant of hypertension, gout, GERD, asthma, hyperlipidemia, type 2 diabetes, history of vertigo.   ?Recently seen in the ED on 3/10 for vertigo and MRI was negative for acute stroke but showed evidence of mastoiditis.  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  It was felt that mastoiditis could be contributing to her symptoms and she was prescribed Augmentin.  Had a follow-up visit at neurology office on 3/17 but was not having any symptoms at that time.  Seen in the ED again on 4/13 for transient dizziness and stuttering speech.  Brain MRI repeated and was negative for acute stroke.  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  She returns to the ED on 05/01/21 with complaints of dizziness, slurred speech, and right-sided weakness.  No weakness or acute neurologic findings on exam except her speech was slow with occasional pauses.  Neurology reconsulted and recommended repeating brain MRI and obtaining EEG.  EEG done and was normal.  MRI again negative for acute abnormality except for opacification of right mastoid.  ? ? ?PAIN:  ?Are you having pain? No ? ?Vitals:  ? 06/01/21 1502 06/01/21 1503  ?BP: 124/78 114/69  ?Pulse: 90 93  ? ?Sitting and then standing.  ? ? ? ?FALLS: Has patient fallen in last 6 months? No ? ?LIVING  ENVIRONMENT: ?Lives with: lives alone ? ? ?PLOF: Independent ? ?PATIENT GOALS Wants to stop being dizzy.  ? ?OBJECTIVE:  ? ?DIAGNOSTIC FINDINGS: See above in pertinent history.  ? ?COGNITION: ?Overall cognitive status: Within functional limits for tasks assessed ? ?POSTURE: rounded shoulders and forward head ? ? ? ?TRANSFERS: ?Assistive device utilized: None  ?Sit to stand: Complete Independence ?Stand to sit: Complete Independence ? ?GAIT: ?Gait pattern: step through pattern ?Distance walked: Clinic distances in and out of eval ?Assistive device utilized: None ?Level of assistance: Modified independence ? ? ?FUNCTIONAL TESTs:  ?mCTSIB: ?Condition 1: 30 sec ?Condition 2: 30 sec, mild postural sway  ?Condition 3: 30 sec ?Condition 4: 17.84 seconds, mild/mod postural sway ? ?PATIENT SURVEYS:  ?FOTO DPS 54 (61 predicted), DFS 44 ? ? ?VESTIBULAR ASSESSMENT ? ? GENERAL OBSERVATION: Ambulates into session with no AD. ?  ? SYMPTOM BEHAVIOR: ?  Subjective history: See above.  ?  Non-Vestibular symptoms: headaches ?  Type of dizziness: Imbalance (Disequilibrium), Spinning/Vertigo, "World moves", and "feels like she is on a merry go round" ?  Frequency: Every day, multiple times ?  Duration: Can last seconds to 30 seconds.  ?  Aggravating factors: Spontaneous and Induced by motion: bending down to the ground ?  Relieving factors: lying supine and rest, Meclizine (has to take it as soon as she gets up, won't have as many episodes if she takes it) ?  Progression of symptoms: unchanged ? ? OCULOMOTOR EXAM: ?  Ocular Alignment: normal ?  Ocular ROM: No Limitations ?  Spontaneous Nystagmus: absent ?  Gaze-Induced Nystagmus: absent ?  Smooth Pursuits: intact ?  Saccades: intact ?   ? ? VESTIBULAR - OCULAR REFLEX:  ?  Slow VOR: Normal ?  VOR Cancellation: Normal - at one point felt like she was going to have an episode, but it did not come on.  ?  Head-Impulse Test: HIT Right: negative ?HIT Left: negative ? ?Reports a little  dizziness afterwards.  ?  Dynamic Visual Acuity: Static: line 7 ?Dynamic: line 3 ?4 line difference.  ? ?Mild dizziness/unsteadiness afterwards.  ?  ? POSITIONAL TESTING:  ?Right Dix-Hallpike: none; Duration:N/A ?Left Dix-Hallpike: none; Duration: N/A ?Right Roll Test: none; Duration: N/A ?Left Roll Test: none; Duration: N/A ?Right Sidelying: none; Duration: N/A ?Left Sidelying: none; Duration: N/A ?  ? ?MOTION SENSITIVITY: ? ?  Motion Sensitivity Quotient ? ?Intensity: 0 = none, 1 = Lightheaded, 2 = Mild, 3 = Moderate, 4 = Severe, 5 = Vomiting ? Intensity  ?1. Sitting to supine 0  ?2. Supine to L side 0  ?3. Supine to R side 0  ?4. Supine to sitting   ?5. L Hallpike-Dix 0  ?6. Up from L  1  ?7. R Hallpike-Dix 0  ?8. Up from R  1  ?9. Sitting,  head  ?tipped to L knee 0  ?10. Head up from L  ?knee 0  ?11. Sitting, head  ?tipped to R knee 0  ?12. Head up from R  ?knee 0  ?13. Standing head turns x5 0  ?14.Standing head nods x5 2  ?15. In stance, 180?  ?turn to L  0  ?16. In stance, 180?  ?turn to R 0  ?  ? ? ? ?VESTIBULAR TREATMENT: ? ?N/A during eval.  ? ?PATIENT EDUCATION: ?Education details: Clinical findings, POC.  ?Person educated: Patient ?Education method: Explanation ?Education comprehension: verbalized understanding ? ? ?GOALS: ?Goals reviewed with patient? Yes ? ?SHORT TERM GOALS: Target date: 06/22/2021  ? ?Pt will undergo further assessment of FGA with LTG written as appropriate.  ?Baseline: Not yet assessed.  ?Goal status: INITIAL ? ?2.  Pt will undergo further assessment of SOT with LTG written.  ?Baseline: Not yet assessed.  ?Goal status: INITIAL ? ?3.  Pt will hold condition 4 of mCTSIB for at least 25 seconds for improved vestibular input for balance.  ?Baseline: 17.84 seconds  ?Goal status: INITIAL ? ? ?LONG TERM GOALS: Target date: 07/13/2021 ? ?Pt will be independent with final HEP in order to build upon functional gains made in therapy. ? ?Baseline:  ?Goal status: INITIAL ? ?2.  FGA goal to be  written as appropriate.  ?Baseline:  ?Goal status: INITIAL ? ?3.  SOT goal to be written as appropriate.  ?Baseline:  ?Goal status: INITIAL ? ?4.  Pt will hold condition 4 of mCTSIB for at least 30  seconds for improved

## 2021-06-10 ENCOUNTER — Encounter: Payer: Self-pay | Admitting: Physical Therapy

## 2021-06-10 ENCOUNTER — Ambulatory Visit: Payer: Medicare Other | Admitting: Physical Therapy

## 2021-06-10 DIAGNOSIS — R2681 Unsteadiness on feet: Secondary | ICD-10-CM | POA: Diagnosis not present

## 2021-06-10 DIAGNOSIS — R42 Dizziness and giddiness: Secondary | ICD-10-CM

## 2021-06-10 NOTE — Patient Instructions (Signed)
Gaze Stabilization: Sitting    Keeping eyes on target on wall a few feet away, tilt head down 15-30 and move head side to side for __60__ seconds. Repeat while moving head up and down for _60___ seconds. Perform 3 times each direction.  Do __3__ sessions per day.  Gaze Stabilization: Tip Card  1.Target must remain in focus, not blurry, and appear stationary while head is in motion. 2.Perform exercises with small head movements (45 to either side of midline). 3.Increase speed of head motion so long as target is in focus. 4.If you wear eyeglasses, be sure you can see target through lens (therapist will give specific instructions for bifocal / progressive lenses). 5.These exercises may provoke dizziness or nausea. Work through these symptoms. If too dizzy, slow head movement slightly. Rest between each exercise. 6.Exercises demand concentration; avoid distractions. 7.For safety, perform standing exercises close to a counter, wall, corner, or next to someone.  Copyright  VHI. All rights reserved.    Copyright  VHI. All rights reserved.

## 2021-06-10 NOTE — Therapy (Signed)
OUTPATIENT PHYSICAL THERAPY VESTIBULAR TREATMENT NOTE   Patient Name: [name] MRN: [id number] DOB:[birth year], 66 y.o., female Today's Date: 06/10/2021  PCP: Rogers Blocker, MD REFERRING PROVIDER: Melburn Popper, MD    PT End of Session - 06/10/21 1530     Visit Number 2    Number of Visits 7    Date for PT Re-Evaluation 08/30/21   due to potential delay in scheduling   Authorization Type [employer] Medicare    PT Start Time 1529    PT Stop Time 1607    PT Time Calculation (min) 38 min    Activity Tolerance Patient tolerated treatment well    Behavior During Therapy WFL for tasks assessed/performed             Past Medical History:  Diagnosis Date   Arthritis    Essential hypertension, malignant    Extrinsic asthma, unspecified    GERD (gastroesophageal reflux disease)    Hyperlipidemia    Nonspecific elevation of levels of transaminase or lactic acid dehydrogenase (LDH)    Obesity, unspecified    Pain in joint, site unspecified    Type II or unspecified type diabetes mellitus without mention of complication, not stated as uncontrolled    Past Surgical History:  Procedure Laterality Date   ABDOMINAL HYSTERECTOMY     Patient Active Problem List   Diagnosis Date Noted   Vertigo 05/01/2021   Normal anion gap metabolic acidosis 62/26/3335   Essential hypertension 05/01/2021   GERD (gastroesophageal reflux disease) 05/01/2021   Asthma, chronic 05/01/2021   Type 2 diabetes mellitus ([facility]) 05/01/2021    ONSET DATE: 05/26/2021 (date of referral)  REFERRING DIAG: R42 (ICD-10-CM) - Dizziness   THERAPY DIAG:  Unsteadiness on feet  Dizziness and giddiness  Rationale for Evaluation and Treatment Rehabilitation  PERTINENT HISTORY: Lauren Cantu is a 66 y.o. female with medical history significant of hypertension, gout, GERD, asthma, hyperlipidemia, type 2 diabetes, history of vertigo.   Recently seen in the ED on 3/10 for vertigo and MRI was negative for  acute stroke but showed evidence of mastoiditis.  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  It was felt that mastoiditis could be contributing to her symptoms and she was prescribed Augmentin.  Had a follow-up visit at neurology office on 3/17 but was not having any symptoms at that time.  Seen in the ED again on 4/13 for transient dizziness and stuttering speech.  Brain MRI repeated and was negative for acute stroke.  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  She returns to the ED on 05/01/21 with complaints of dizziness, slurred speech, and right-sided weakness.  No weakness or acute neurologic findings on exam except her speech was slow with occasional pauses.  Neurology reconsulted and recommended repeating brain MRI and obtaining EEG.  EEG done and was normal.  MRI again negative for acute abnormality except for opacification of right mastoid.     SUBJECTIVE: No changes, had to take a meclizine due to the dizziness. Episodes are about the same, just depends on the day. Pt has anywhere from 4-12 mild episodes of dizziness a day.   PAIN:  Are you having pain? No    OBJECTIVE:    [facility] PT Assessment - 06/10/21 1532       Functional Gait  Assessment   Gait assessed  Yes    Gait Level Surface Walks 20 ft in less than 7 sec but greater  than 5.5 sec, uses assistive device, slower speed, mild gait deviations, or deviates 6-10 in outside of the 12 in walkway width.    Change in Gait Speed Able to smoothly change walking speed without loss of balance or gait deviation. Deviate no more than 6 in outside of the 12 in walkway width.    Gait with Horizontal Head Turns Performs head turns smoothly with slight change in gait velocity (eg, minor disruption to smooth gait path), deviates 6-10 in outside 12 in walkway width, or uses an assistive device.   mild dizziness   Gait with Vertical Head Turns Performs task with slight change in gait velocity (eg,  minor disruption to smooth gait path), deviates 6 - 10 in outside 12 in walkway width or uses assistive device   "little woozy"   Gait and Pivot Turn Pivot turns safely in greater than 3 sec and stops with no loss of balance, or pivot turns safely within 3 sec and stops with mild imbalance, requires small steps to catch balance.   mild dizziness   Step Over Obstacle Is able to step over one shoe box (4.5 in total height) without changing gait speed. No evidence of imbalance.    Gait with Narrow Base of Support Ambulates less than 4 steps heel to toe or cannot perform without assistance.    Gait with Eyes Closed Walks 20 ft, slow speed, abnormal gait pattern, evidence for imbalance, deviates 10-15 in outside 12 in walkway width. Requires more than 9 sec to ambulate 20 ft.   19.84 seconds   Ambulating Backwards Walks 20 ft, uses assistive device, slower speed, mild gait deviations, deviates 6-10 in outside 12 in walkway width.   15.72 seconds   Steps Alternating feet, must use rail.    Total Score 18    FGA comment: High fall risk.              VESTIBULAR TREATMENT:   Gaze Adaptation: x1 Viewing Horizontal: Position: Seated, Time: 2 x 30 seconds, x60 seconds  and Comment: 1-2/10 dizziness after 60 seconds  and x1 Viewing Vertical:  Position: Seated, Time: x 30 seconds, x60 seconds  and Comment: Mild dizziness after 60 seconds.     Other:  In corner on 2 pillows; EO; x10 reps head turns, x10 reps head nods Wide BOS: EC x10 reps head turns, x10 reps head nods  Access Code: [email or web address] URL: [email or web address] Date: 06/10/2021 Prepared by: Janann August  Initiated HEP for balance and VOR (see pt instructions)  Exercises - Romberg Stance Eyes Closed on Foam Pad  - 1 x daily - 5 x weekly - 3 sets - 30 hold - Standing Balance with Eyes Closed on Foam  - 1 x daily - 5 x weekly - 2 sets - 10 reps - Tandem Walking with Counter Support  - 1 x daily - 5 x weekly - 3  sets    PATIENT EDUCATION: Education details: Results of FGA, initial HEP for balance/VOR  Person educated: Patient Education method: Explanation and Handout  Education comprehension: verbalized and demonstrated understanding  HEP: [id number] and seated VOR x60 seconds      GOALS: Goals reviewed with patient? Yes   SHORT TERM GOALS: Target date: 06/22/2021    Pt will undergo further assessment of FGA with LTG written as appropriate.  Baseline:18/30 Goal status: MET   2.  Pt will undergo further assessment of SOT with LTG written.  Baseline: Not yet assessed.  Goal status: INITIAL   3.  Pt will hold condition 4 of mCTSIB for at least 25 seconds for improved vestibular input for balance.  Baseline: 17.84 seconds  Goal status: INITIAL     LONG TERM GOALS: Target date: 07/13/2021   Pt will be independent with final HEP in order to build upon functional gains made in therapy.   Baseline:  Goal status: INITIAL   2.  Pt will improve FGA to at least a 23/30 in order to demo decr fall risk.  Baseline: 18/30 Goal status: REVISED   3.  SOT goal to be written as appropriate.  Baseline:  Goal status: INITIAL   4.  Pt will hold condition 4 of mCTSIB for at least 30  seconds for improved vestibular input for balance.  Baseline: 17.84 seconds  Goal status: INITIAL   5.  Pt will perform DVA with 2 line or less difference in order to demo improved VOR.  Baseline: 4 line difference.  Goal status: INITIAL   6.  Pt will improve DPS score to 61 in order to demo improved functional outcomes related to dizziness.  Baseline: 51 Goal status: INITIAL   ASSESSMENT:   CLINICAL IMPRESSION: Performed the FGA today with pt scoring an 18/30 indicating a high fall risk. Pt having mild dizziness with head motions and turns. Remainder of session focused on initiating HEP for balance with narrow BOS, incr vestibular input, and VOR. Pt tolerating well, pt only with mild dizziness with VOR. Will  continue to progress towards LTGs.        OBJECTIVE IMPAIRMENTS decreased balance, dizziness, and postural dysfunction.    ACTIVITY LIMITATIONS community activity.    PERSONAL FACTORS Behavior pattern, Past/current experiences, Time since onset of injury/illness/exacerbation, and 3+ comorbidities: hypertension, gout, GERD, asthma, hyperlipidemia, type 2 diabetes, history of vertigo.    are also affecting patient's functional outcome.      REHAB POTENTIAL: Good   CLINICAL DECISION MAKING: Stable/uncomplicated   EVALUATION COMPLEXITY: Low     PLAN: PT FREQUENCY: 1x/week   PT DURATION: 12 weeks   PLANNED INTERVENTIONS: Therapeutic exercises, Therapeutic activity, Neuromuscular re-education, Balance training, Gait training, Patient/Family education, and Vestibular training   PLAN FOR NEXT SESSION: How was ENT? Perform SOT with goal written. Work on balance with head nods, eyes closed balance, VOR x1. SLS, narrow BOS/tandem.        Arliss Journey, PT, DPT  06/10/2021, 4:12 PM

## 2021-06-17 ENCOUNTER — Ambulatory Visit: Payer: Medicare Other | Attending: Neurology | Admitting: Physical Therapy

## 2021-06-17 ENCOUNTER — Encounter: Payer: Self-pay | Admitting: Physical Therapy

## 2021-06-17 DIAGNOSIS — R42 Dizziness and giddiness: Secondary | ICD-10-CM | POA: Insufficient documentation

## 2021-06-17 DIAGNOSIS — R2681 Unsteadiness on feet: Secondary | ICD-10-CM | POA: Insufficient documentation

## 2021-06-17 DIAGNOSIS — H8111 Benign paroxysmal vertigo, right ear: Secondary | ICD-10-CM | POA: Insufficient documentation

## 2021-06-17 NOTE — Therapy (Addendum)
OUTPATIENT PHYSICAL THERAPY VESTIBULAR TREATMENT NOTE   Patient Name: Lauren Cantu MRN: [id number] DOB:[birth year], 66 y.o., female Today's Date: 06/17/2021  PCP: Rogers Blocker, MD REFERRING PROVIDER: Melburn Popper, MD    PT End of Session - 06/17/21 1533     Visit Number 3    Number of Visits 7    Date for PT Re-Evaluation 08/30/21   due to potential delay in scheduling   Authorization Type [employer] Medicare    PT Start Time 1532    PT Stop Time 1612    PT Time Calculation (min) 40 min    Activity Tolerance Patient tolerated treatment well    Behavior During Therapy WFL for tasks assessed/performed             Past Medical History:  Diagnosis Date   Arthritis    Essential hypertension, malignant    Extrinsic asthma, unspecified    GERD (gastroesophageal reflux disease)    Hyperlipidemia    Nonspecific elevation of levels of transaminase or lactic acid dehydrogenase (LDH)    Obesity, unspecified    Pain in joint, site unspecified    Type II or unspecified type diabetes mellitus without mention of complication, not stated as uncontrolled    Past Surgical History:  Procedure Laterality Date   ABDOMINAL HYSTERECTOMY     Patient Active Problem List   Diagnosis Date Noted   Vertigo 05/01/2021   Normal anion gap metabolic acidosis 97/98/9211   Essential hypertension 05/01/2021   GERD (gastroesophageal reflux disease) 05/01/2021   Asthma, chronic 05/01/2021   Type 2 diabetes mellitus ([place]) 05/01/2021    ONSET DATE: 05/26/2021 (date of referral)  REFERRING DIAG: R42 (ICD-10-CM) - Dizziness   THERAPY DIAG:  Unsteadiness on feet  Dizziness and giddiness  BPPV (benign paroxysmal positional vertigo), right  Rationale for Evaluation and Treatment Rehabilitation  PERTINENT HISTORY: Lauren Cantu is a 66 y.o. female with medical history significant of hypertension, gout, GERD, asthma, hyperlipidemia, type 2 diabetes, history of vertigo.   Recently seen in  the ED on 3/10 for vertigo and MRI was negative for acute stroke but showed evidence of mastoiditis.  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  It was felt that mastoiditis could be contributing to her symptoms and she was prescribed Augmentin.  Had a follow-up visit at neurology office on 3/17 but was not having any symptoms at that time.  Seen in the ED again on 4/13 for transient dizziness and stuttering speech.  Brain MRI repeated and was negative for acute stroke.  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  She returns to the ED on 05/01/21 with complaints of dizziness, slurred speech, and right-sided weakness.  No weakness or acute neurologic findings on exam except her speech was slow with occasional pauses.  Neurology reconsulted and recommended repeating brain MRI and obtaining EEG.  EEG done and was normal.  MRI again negative for acute abnormality except for opacification of right mastoid.     SUBJECTIVE: Saw the ENT the other day. They recommended audiometric evaluation with vestibular testing (Dix-Hallpike maneuver). Pt has not taken a Meclizine since last night. Has had a couple episodes of dizziness today.   PAIN:  Are you having pain? No    OBJECTIVE:     VESTIBULAR TREATMENT:   POSITIONAL TESTS:  Re-assessed positional testing as pt had not taken a meclizine since last night. At eval visit, pt had taken a Meclizine prior.  Right Dix-Hallpike: upbeating, right nystagmus; ~20 second latency period before nystagmus started and VERY faint, only about 3-5 beats Left Dix-Hallpike: none; Duration: N/A Right Roll Test: none; Duration: N/A Left Roll Test: none; Duration: N/A Right Sidelying: none; Duration: pt reporting very mild dizziness that lasts on a couple seconds, feels like a merry go round and feels like she is going down into the mat.  Left Sidelying: none; Duration: N/A   VESTIBULAR TREATMENT:  Canalith  Repositioning: Epley Right: Number of Reps: 1, Response to Treatment: comment: pt unsure if she is better or not, will have to see if she has an episode or not     Standing Balance: Surface: Airex Position: Narrow Base of Support Completed with: Eyes Closed; 2 x 30 seconds  Head Turns x 5 Reps and x10 reps and Head Nods x 5 Reps and x10 reps with narrow BOS, pt with incr postural sway with head nods and needing intermittent taps for balance.  Over 15' forward and retro gait with EC x4 reps with min guard for balance, pt veering more towards L.       PATIENT EDUCATION: Education details: Results of positional testing, possible R posterior canalithiasis and purpose of Epley maneuver. Trying to sleep on L side instead of R side and trying not to take Meclizine if she can help it to see if maneuver helped with pt's symptoms.   Person educated: Patient Education method: Explanation  Education comprehension: verbalized understanding  HEP: [id number] and seated VOR x60 seconds      GOALS: Goals reviewed with patient? Yes   SHORT TERM GOALS: Target date: 06/22/2021    Pt will undergo further assessment of FGA with LTG written as appropriate.  Baseline:18/30 Goal status: MET   2.  Pt will undergo further assessment of SOT with LTG written.  Baseline: Not yet assessed.  Goal status: INITIAL   3.  Pt will hold condition 4 of mCTSIB for at least 25 seconds for improved vestibular input for balance.  Baseline: 17.84 seconds  Goal status: INITIAL     LONG TERM GOALS: Target date: 07/13/2021   Pt will be independent with final HEP in order to build upon functional gains made in therapy.   Baseline:  Goal status: INITIAL   2.  Pt will improve FGA to at least a 23/30 in order to demo decr fall risk.  Baseline: 18/30 Goal status: REVISED   3.  SOT goal to be written as appropriate.  Baseline:  Goal status: INITIAL   4.  Pt will hold condition 4 of mCTSIB for at least 30  seconds  for improved vestibular input for balance.  Baseline: 17.84 seconds  Goal status: INITIAL   5.  Pt will perform DVA with 2 line or less difference in order to demo improved VOR.  Baseline: 4 line difference.  Goal status: INITIAL   6.  Pt will improve DPS score to 61 in order to demo improved functional outcomes related to dizziness.  Baseline: 51 Goal status: INITIAL   ASSESSMENT:   CLINICAL IMPRESSION: Re-assessed positional testing today as pt still having episodes of spinning/dizziness during the day and pt had not yet taken a meclizine today. When assessed at eval, no nystagmus was noted and pt did not have dizziness (pt took a meclizine that day). When assessing with R Marye Round, noted very faint upbeating R rotary nystagmus that only lasted ~3-5 beats and had a latency period of about 20 seconds. Tried Epley maneuver x1  rep to see if it will help with sx and episodes. Pt reporting no change in sx afterwards. Will see if pt notices a difference with sx at home.  New cert sent to MD to include canalith repositioning. Will continue to progress towards LTGs.        OBJECTIVE IMPAIRMENTS decreased balance, dizziness, and postural dysfunction.    ACTIVITY LIMITATIONS community activity.    PERSONAL FACTORS Behavior pattern, Past/current experiences, Time since onset of injury/illness/exacerbation, and 3+ comorbidities: hypertension, gout, GERD, asthma, hyperlipidemia, type 2 diabetes, history of vertigo.    are also affecting patient's functional outcome.      REHAB POTENTIAL: Good   CLINICAL DECISION MAKING: Stable/uncomplicated   EVALUATION COMPLEXITY: Low     PLAN: PT FREQUENCY: 1x/week   PT DURATION: 12 weeks   PLANNED INTERVENTIONS: Therapeutic exercises, Therapeutic activity, Neuromuscular re-education, Balance training, Gait training, Patient/Family education, and Vestibular training   PLANNED INTERVENTIONS: Therapeutic exercises, Therapeutic activity,  Neuromuscular re-education, Balance training, Gait training, Patient/Family education, Vestibular training, and Canalith repositioning      PLAN FOR NEXT SESSION: Re-assess for R posterior canalithiasis. Perform SOT with goal written. Work on balance with head nods, eyes closed balance, VOR x1. SLS, narrow BOS/tandem.        Arliss Journey, PT, DPT  06/17/2021, 4:25 PM

## 2021-06-21 ENCOUNTER — Ambulatory Visit: Payer: Medicare Other | Admitting: Physical Therapy

## 2021-06-21 ENCOUNTER — Encounter: Payer: Self-pay | Admitting: Physical Therapy

## 2021-06-21 VITALS — BP 115/81 | HR 91

## 2021-06-21 DIAGNOSIS — R2681 Unsteadiness on feet: Secondary | ICD-10-CM

## 2021-06-21 DIAGNOSIS — R42 Dizziness and giddiness: Secondary | ICD-10-CM

## 2021-06-21 NOTE — Addendum Note (Signed)
Addended by: Arliss Journey on: 06/21/2021 02:07 PM   Modules accepted: Orders

## 2021-06-21 NOTE — Therapy (Signed)
OUTPATIENT PHYSICAL THERAPY VESTIBULAR TREATMENT NOTE   Patient Name: Lauren Cantu MRN: [id number] DOB:1955-08-27, 66 y.o., female Today's Date: 06/21/2021  PCP: Rogers Blocker, MD REFERRING PROVIDER: Melburn Popper, MD    PT End of Session - 06/21/21 1438     Visit Number 4    Number of Visits 7    Date for PT Re-Evaluation 08/30/21   due to potential delay in scheduling   Authorization Type [employer] Medicare    PT Start Time 1435    PT Stop Time 1522    PT Time Calculation (min) 47 min    Activity Tolerance Patient tolerated treatment well    Behavior During Therapy WFL for tasks assessed/performed             Past Medical History:  Diagnosis Date   Arthritis    Essential hypertension, malignant    Extrinsic asthma, unspecified    GERD (gastroesophageal reflux disease)    Hyperlipidemia    Nonspecific elevation of levels of transaminase or lactic acid dehydrogenase (LDH)    Obesity, unspecified    Pain in joint, site unspecified    Type II or unspecified type diabetes mellitus without mention of complication, not stated as uncontrolled    Past Surgical History:  Procedure Laterality Date   ABDOMINAL HYSTERECTOMY     Patient Active Problem List   Diagnosis Date Noted   Vertigo 05/01/2021   Normal anion gap metabolic acidosis 00/45/9977   Essential hypertension 05/01/2021   GERD (gastroesophageal reflux disease) 05/01/2021   Asthma, chronic 05/01/2021   Type 2 diabetes mellitus ([place]) 05/01/2021    ONSET DATE: 05/26/2021 (date of referral)  REFERRING DIAG: R42 (ICD-10-CM) - Dizziness   THERAPY DIAG:  Unsteadiness on feet  Dizziness and giddiness  Rationale for Evaluation and Treatment Rehabilitation  PERTINENT HISTORY: Lauren Cantu is a 66 y.o. female with medical history significant of hypertension, gout, GERD, asthma, hyperlipidemia, type 2 diabetes, history of vertigo.   Recently seen in the ED on 3/10 for vertigo and MRI was negative for  acute stroke but showed evidence of mastoiditis.  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  It was felt that mastoiditis could be contributing to her symptoms and she was prescribed Augmentin.  Had a follow-up visit at neurology office on 3/17 but was not having any symptoms at that time.  Seen in the ED again on 4/13 for transient dizziness and stuttering speech.  Brain MRI repeated and was negative for acute stroke.  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  She returns to the ED on 05/01/21 with complaints of dizziness, slurred speech, and right-sided weakness.  No weakness or acute neurologic findings on exam except her speech was slow with occasional pauses.  Neurology reconsulted and recommended repeating brain MRI and obtaining EEG.  EEG done and was normal.  MRI again negative for acute abnormality except for opacification of right mastoid.     SUBJECTIVE: Reports no change in symptoms after Epley maneuver the other day, had to take a meclizine on Friday at 4PM due to dizziness and has been taken it ever since everyday. Has been having a lot of lightheadedness. Going to see PCP on June 27th and is going to ask about a referral to the cardiologist. Reports balance exercises are going well at home. Has not been doing the letter exercises  PAIN:  Are you having pain? No  Vitals:   06/21/21 1444  BP: 115/81  Pulse: 91     OBJECTIVE:     VESTIBULAR TREATMENT:   Conditions: 1:  2 WNL, 1 below normal 2: 3 trials below normal 3:  2 WNL, 1 below normal 4: 1 fall, 1 below normal, 1 WNL 5: 2 WNL, 1 below normal 6: 1 WNL, 2 falls  Composite score: 57 (below normal)  Sensory Analysis Som: Below normal (~80) Vis: Below normal (~55) Vest: Above normal Pref: Below normal (~70) Strategy analysis: Ankle>hip strategy COG alignment: Pt with in center, slightly posteriorly.      Gaze Adaptation:   x1 Viewing Horizontal: Position:  Seated - 2 x60 seconds, Standing x30 seconds and x60 seconds, and Comment: Cues to slightly incr speed, no dizziness   and x1 Viewing Vertical:  Position: Seated, x60 seconds  Standing x30 sec, x60 sec and Comment: No dizziness.     Reviewed gaze exercises as pt had not yet tried them at home. Educated on purpose on performing for home.     PATIENT EDUCATION: Education details: Results of  SOT and areas in therapy to work on for pt's balance/dizziness based on assessment findings. Progressing standing VOR to 60 seconds.  Person educated: Patient Education method: Explanation  Education comprehension: verbalized understanding  HEP: [id number] and standing VOR x60 seconds      GOALS: Goals reviewed with patient? Yes   SHORT TERM GOALS: Target date: 06/22/2021    Pt will undergo further assessment of FGA with LTG written as appropriate.  Baseline:18/30 Goal status: MET   2.  Pt will undergo further assessment of SOT with LTG written.  Baseline: Not yet assessed.  Goal status: MET    3.  Pt will hold condition 4 of mCTSIB for at least 25 seconds for improved vestibular input for balance.  Baseline: 17.84 seconds  Goal status: INITIAL     LONG TERM GOALS: Target date: 07/13/2021   Pt will be independent with final HEP in order to build upon functional gains made in therapy.   Baseline:  Goal status: INITIAL   2.  Pt will improve FGA to at least a 23/30 in order to demo decr fall risk.  Baseline: 18/30 Goal status: REVISED   3.  Pt will improve composite score to WNL and somatosensory/visual sensory analysis scores to [facility] in order to demo improved balance.  Baseline: Composite: 57 (below normal), somatosensory and visual sensory analysis below normal  Goal status: REVISED    4.  Pt will hold condition 4 of mCTSIB for at least 30  seconds for improved vestibular input for balance.  Baseline: 17.84 seconds  Goal status: INITIAL   5.  Pt will perform DVA with 2 line or less  difference in order to demo improved VOR.  Baseline: 4 line difference.  Goal status: INITIAL   6.  Pt will improve DPS score to 61 in order to demo improved functional outcomes related to dizziness.  Baseline: 51 Goal status: INITIAL   ASSESSMENT:   CLINICAL IMPRESSION: Performed the SOT today (see above for further details), with pt's composite score of 57 being below normal. With sensory analysis, pt scored below normal for somatosensory and visual symptoms, but normal for vestibular. LTG written. Reviewed VOR x1 exercises today and able to progress to standing for 60 seconds with pt having no dizziness. Educated on importance of performing at home as pt had not yet performed them. Pt had no change in sx after performing the Epley maneuver last session, still had  spinning episodes and continued to need to take her Meclizine.  Will continue to progress towards LTGs.        OBJECTIVE IMPAIRMENTS decreased balance, dizziness, and postural dysfunction.    ACTIVITY LIMITATIONS community activity.    PERSONAL FACTORS Behavior pattern, Past/current experiences, Time since onset of injury/illness/exacerbation, and 3+ comorbidities: hypertension, gout, GERD, asthma, hyperlipidemia, type 2 diabetes, history of vertigo.    are also affecting patient's functional outcome.      REHAB POTENTIAL: Good   CLINICAL DECISION MAKING: Stable/uncomplicated   EVALUATION COMPLEXITY: Low     PLAN: PT FREQUENCY: 1x/week   PT DURATION: 12 weeks   PLANNED INTERVENTIONS: Therapeutic exercises, Therapeutic activity, Neuromuscular re-education, Balance training, Gait training, Patient/Family education, and Vestibular training   PLANNED INTERVENTIONS: Therapeutic exercises, Therapeutic activity, Neuromuscular re-education, Balance training, Gait training, Patient/Family education, Vestibular training, and Canalith repositioning      PLAN FOR NEXT SESSION: Try Nestor Lewandowsky as Epley did not work last  time and for habituation? Work on balance with head nods, eyes closed balance, VOR x1 (try with use of busy background). SLS, narrow BOS/tandem.  Try rockerboard.       Arliss Journey, PT, DPT  06/21/2021, 3:27 PM

## 2021-06-28 ENCOUNTER — Ambulatory Visit: Payer: Medicare Other | Admitting: Physical Therapy

## 2021-06-28 ENCOUNTER — Encounter: Payer: Self-pay | Admitting: Physical Therapy

## 2021-06-28 DIAGNOSIS — R2681 Unsteadiness on feet: Secondary | ICD-10-CM | POA: Diagnosis not present

## 2021-06-28 DIAGNOSIS — R42 Dizziness and giddiness: Secondary | ICD-10-CM

## 2021-06-28 NOTE — Therapy (Addendum)
OUTPATIENT PHYSICAL THERAPY VESTIBULAR TREATMENT NOTE   Patient Name: Lauren Cantu MRN: [id number] DOB:1955/11/17, 66 y.o., female Today's Date: 06/28/2021  PCP: Rogers Blocker, MD REFERRING PROVIDER: Melburn Popper, MD    PT End of Session - 06/28/21 1404     Visit Number 5    Number of Visits 7    Date for PT Re-Evaluation 08/30/21   due to potential delay in scheduling   Authorization Type [employer] Medicare    PT Start Time 1402    PT Stop Time 1443    PT Time Calculation (min) 41 min    Activity Tolerance Patient tolerated treatment well    Behavior During Therapy WFL for tasks assessed/performed             Past Medical History:  Diagnosis Date   Arthritis    Essential hypertension, malignant    Extrinsic asthma, unspecified    GERD (gastroesophageal reflux disease)    Hyperlipidemia    Nonspecific elevation of levels of transaminase or lactic acid dehydrogenase (LDH)    Obesity, unspecified    Pain in joint, site unspecified    Type II or unspecified type diabetes mellitus without mention of complication, not stated as uncontrolled    Past Surgical History:  Procedure Laterality Date   ABDOMINAL HYSTERECTOMY     Patient Active Problem List   Diagnosis Date Noted   Vertigo 05/01/2021   Normal anion gap metabolic acidosis 03/50/0938   Essential hypertension 05/01/2021   GERD (gastroesophageal reflux disease) 05/01/2021   Asthma, chronic 05/01/2021   Type 2 diabetes mellitus ([place]) 05/01/2021    ONSET DATE: 05/26/2021 (date of referral)  REFERRING DIAG: R42 (ICD-10-CM) - Dizziness   THERAPY DIAG:  Unsteadiness on feet  Dizziness and giddiness  Rationale for Evaluation and Treatment Rehabilitation  PERTINENT HISTORY: Lauren Cantu is a 66 y.o. female with medical history significant of hypertension, gout, GERD, asthma, hyperlipidemia, type 2 diabetes, history of vertigo.   Recently seen in the ED on 3/10 for vertigo and MRI was negative for  acute stroke but showed evidence of mastoiditis.  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  It was felt that mastoiditis could be contributing to her symptoms and she was prescribed Augmentin.  Had a follow-up visit at neurology office on 3/17 but was not having any symptoms at that time.  Seen in the ED again on 4/13 for transient dizziness and stuttering speech.  Brain MRI repeated and was negative for acute stroke.  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  She returns to the ED on 05/01/21 with complaints of dizziness, slurred speech, and right-sided weakness.  No weakness or acute neurologic findings on exam except her speech was slow with occasional pauses.  Neurology reconsulted and recommended repeating brain MRI and obtaining EEG.  EEG done and was normal.  MRI again negative for acute abnormality except for opacification of right mastoid.     SUBJECTIVE: Reports exercises are going well at home. Had a severe dizziness episode on Sunday, had to lay down twice. Reports it started after she did her letter exercises, thinks maybe she went too fast.   PAIN:  Are you having pain? No  There were no vitals filed for this visit.    OBJECTIVE:     VESTIBULAR TREATMENT:  Lauren Cantu for habituation/possible residual R posterior canal BPPV ([place]y did not have any effect on patient); performed x4 reps each side. No  dizziness in sidelying positions, just mild dizziness when coming back up to midline. Pt reporting improvements with incr reps. Added to pt's HEP for habituation.    Gaze Adaptation:   x1 Viewing Horizontal: Position: Standing and 2x60 seconds, and Comment: Cues for proper cervical ROM (pt initially going too far into rotation)   and x1 Viewing Vertical:   Standing 2 x60 sec and Comment: No dizziness.     Reviewed proper ROM and technique. Pt reporting when she did them at home over the weekend had a dizziness episode 30  minutes-1 hr afterwards.    Standing Balance: Surface: Airex Position: Narrow Base of Support Completed with: Eyes Closed; feet together 2 x 30 seconds static balance, mild postural sway.   With feet hip width EC: x10 reps head turns, x10 reps head nods, then repeated with feet together, pt more challenged with head nods.      On rockerboard in A/P direction: -Weight shifting x15 reps EO with cues for full ROM when shifting weight, cues to use hip/ankle strategy for balance.  -Trying to keep board still and EC 2 x 30 seconds- pt with incr postural sway and tendency to lose balance anteriorly.  -Rocking board forwards backwards 2 sets of 10 reps with EC  Gait over 230' with head turns - with pt having to look to R and L and read color and number on card, initial cues to speed up gait with pt able to maintain speed. Then performed 230' with head nods. Pt with no dizziness throughout and good balance.   Gait 2 x 30' each direction with holding ball and moving it CW and CCW and tracking it with head and eyes. Only very mild dizziness when stopping.      PATIENT EDUCATION: Education details: Lauren Cantu addition to HEP.  Person educated: Patient Education method: Explanation, demonstration and handout Education comprehension: verbalized/demonstrated understanding  HEP: [id number] and standing VOR x60 seconds      GOALS: Goals reviewed with patient? Yes   SHORT TERM GOALS: Target date: 06/22/2021    Pt will undergo further assessment of FGA with LTG written as appropriate.  Baseline:18/30 Goal status: MET   2.  Pt will undergo further assessment of SOT with LTG written.  Baseline: Not yet assessed.  Goal status: MET    3.  Pt will hold condition 4 of mCTSIB for at least 25 seconds for improved vestibular input for balance.  Baseline: 17.84 seconds; 30 seconds on 06/28/21 Goal status: MET     LONG TERM GOALS: Target date: 07/13/2021   Pt will be independent with final HEP in  order to build upon functional gains made in therapy.   Baseline:  Goal status: INITIAL   2.  Pt will improve FGA to at least a 23/30 in order to demo decr fall risk.  Baseline: 18/30 Goal status: REVISED   3.  Pt will improve composite score to WNL and somatosensory/visual sensory analysis scores to [facility] in order to demo improved balance.  Baseline: Composite: 57 (below normal), somatosensory and visual sensory analysis below normal  Goal status: REVISED    4.  Pt will hold condition 4 of mCTSIB for at least 30  seconds for improved vestibular input for balance.  Baseline: 17.84 seconds  Goal status: INITIAL   5.  Pt will perform DVA with 2 line or less difference in order to demo improved VOR.  Baseline: 4 line difference.  Goal status: INITIAL   6.  Pt will  improve DPS score to 61 in order to demo improved functional outcomes related to dizziness.  Baseline: 51 Goal status: INITIAL   ASSESSMENT:   CLINICAL IMPRESSION: Trialed Lauren Cantu today with pt with no dizziness in sidelying positions, just mild dizziness when coming back up to sitting. Added to pt's HEP for habituation. Pt met STG #3 - indicating improvement with vestibular input for balance. Continued to focus on balance strategies with head motions, eyes closed, and compliant surfaces. Pt tolerated session well. Only had an episode of mild dizziness after stopping tracking exercises during gait with ball that subsided quickly. Will continue to progress towards LTGs.        OBJECTIVE IMPAIRMENTS decreased balance, dizziness, and postural dysfunction.    ACTIVITY LIMITATIONS community activity.    PERSONAL FACTORS Behavior pattern, Past/current experiences, Time since onset of injury/illness/exacerbation, and 3+ comorbidities: hypertension, gout, GERD, asthma, hyperlipidemia, type 2 diabetes, history of vertigo.    are also affecting patient's functional outcome.      REHAB POTENTIAL: Good   CLINICAL DECISION  MAKING: Stable/uncomplicated   EVALUATION COMPLEXITY: Low     PLAN: PT FREQUENCY: 1x/week   PT DURATION: 12 weeks   PLANNED INTERVENTIONS: Therapeutic exercises, Therapeutic activity, Neuromuscular re-education, Balance training, Gait training, Patient/Family education, and Vestibular training   PLANNED INTERVENTIONS: Therapeutic exercises, Therapeutic activity, Neuromuscular re-education, Balance training, Gait training, Patient/Family education, Vestibular training, and Canalith repositioning      PLAN FOR NEXT SESSION: Work on balance with head motions, eyes closed balance, VOR x1 (try with use of busy background). SLS, narrow BOS/tandem.  Unlevel surfaces.       Arliss Journey, PT, DPT  06/28/2021, 2:44 PM

## 2021-07-05 ENCOUNTER — Ambulatory Visit: Payer: Medicare Other | Admitting: Physical Therapy

## 2021-07-05 ENCOUNTER — Encounter: Payer: Self-pay | Admitting: Physical Therapy

## 2021-07-05 DIAGNOSIS — R2681 Unsteadiness on feet: Secondary | ICD-10-CM

## 2021-07-05 DIAGNOSIS — R42 Dizziness and giddiness: Secondary | ICD-10-CM

## 2021-07-05 NOTE — Therapy (Signed)
OUTPATIENT PHYSICAL THERAPY VESTIBULAR TREATMENT NOTE   Patient Name: Lauren Cantu MRN: [id number] DOB:29-Apr-1955, 66 y.o., female Today's Date: 07/05/2021  PCP: Rogers Blocker, MD REFERRING PROVIDER: Melburn Popper, MD    PT End of Session - 07/05/21 1908     Visit Number 6    Number of Visits 7    Date for PT Re-Evaluation 08/30/21   due to potential delay in scheduling   Authorization Type [employer] Medicare    PT Start Time 1405    PT Stop Time 1445    PT Time Calculation (min) 40 min    Activity Tolerance Patient tolerated treatment well    Behavior During Therapy WFL for tasks assessed/performed              Past Medical History:  Diagnosis Date   Arthritis    Essential hypertension, malignant    Extrinsic asthma, unspecified    GERD (gastroesophageal reflux disease)    Hyperlipidemia    Nonspecific elevation of levels of transaminase or lactic acid dehydrogenase (LDH)    Obesity, unspecified    Pain in joint, site unspecified    Type II or unspecified type diabetes mellitus without mention of complication, not stated as uncontrolled    Past Surgical History:  Procedure Laterality Date   ABDOMINAL HYSTERECTOMY     Patient Active Problem List   Diagnosis Date Noted   Vertigo 05/01/2021   Normal anion gap metabolic acidosis 60/60/0459   Essential hypertension 05/01/2021   GERD (gastroesophageal reflux disease) 05/01/2021   Asthma, chronic 05/01/2021   Type 2 diabetes mellitus ([place]) 05/01/2021    ONSET DATE: 05/26/2021 (date of referral)  REFERRING DIAG: R42 (ICD-10-CM) - Dizziness   THERAPY DIAG:  Unsteadiness on feet  Dizziness and giddiness  Rationale for Evaluation and Treatment Rehabilitation  PERTINENT HISTORY: Lauren Cantu is a 66 y.o. female with medical history significant of hypertension, gout, GERD, asthma, hyperlipidemia, type 2 diabetes, history of vertigo.   Recently seen in the ED on 3/10 for vertigo and MRI was negative for  acute stroke but showed evidence of mastoiditis.  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  It was felt that mastoiditis could be contributing to her symptoms and she was prescribed Augmentin.  Had a follow-up visit at neurology office on 3/17 but was not having any symptoms at that time.  Seen in the ED again on 4/13 for transient dizziness and stuttering speech.  Brain MRI repeated and was negative for acute stroke.  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  She returns to the ED on 05/01/21 with complaints of dizziness, slurred speech, and right-sided weakness.  No weakness or acute neurologic findings on exam except her speech was slow with occasional pauses.  Neurology reconsulted and recommended repeating brain MRI and obtaining EEG.  EEG done and was normal.  MRI again negative for acute abnormality except for opacification of right mastoid.     SUBJECTIVE: Pt reports she has had about 3 episodes of dizziness today - very short duration (lasts approx. Less than 5 seconds); went ahead and took Meclizine; pt reports she takes Meclizine daily.  Pt reports dizziness varies in occurrence - just spontaneously occurs at times; states "I can just be sitting on the couch and it happens"  PAIN:  Are you having pain? No  There were no vitals filed for this visit.    OBJECTIVE:    Gait:  [facility] PT  Assessment - 07/05/21 1414       Functional Gait  Assessment   Gait assessed  Yes    Gait Level Surface Walks 20 ft in less than 5.5 sec, no assistive devices, good speed, no evidence for imbalance, normal gait pattern, deviates no more than 6 in outside of the 12 in walkway width.   4.84   Change in Gait Speed Able to smoothly change walking speed without loss of balance or gait deviation. Deviate no more than 6 in outside of the 12 in walkway width.    Gait with Horizontal Head Turns Performs head turns smoothly with slight change in gait velocity (eg,  minor disruption to smooth gait path), deviates 6-10 in outside 12 in walkway width, or uses an assistive device.   mild dizziness   Gait with Vertical Head Turns Performs task with slight change in gait velocity (eg, minor disruption to smooth gait path), deviates 6 - 10 in outside 12 in walkway width or uses assistive device   mild dizziness   Gait and Pivot Turn Pivot turns safely within 3 sec and stops quickly with no loss of balance.   mild dizziness   Step Over Obstacle Is able to step over 2 stacked shoe boxes taped together (9 in total height) without changing gait speed. No evidence of imbalance.    Gait with Narrow Base of Support Ambulates 7-9 steps.    Gait with Eyes Closed Walks 20 ft, uses assistive device, slower speed, mild gait deviations, deviates 6-10 in outside 12 in walkway width. Ambulates 20 ft in less than 9 sec but greater than 7 sec.   19.84 seconds   Ambulating Backwards Walks 20 ft, uses assistive device, slower speed, mild gait deviations, deviates 6-10 in outside 12 in walkway width.   15.72 seconds   Steps Alternating feet, must use rail.    Total Score 24    FGA comment: --               VESTIBULAR TREATMENT:    Gaze Adaptation:   x1 Viewing Horizontal: Position: Standing 60 seconds; plain background; approx. 4' away from target; pt reported no dizziness upon completion of exercise   x1 Viewing Vertical:   Standing 60 sec and Comment: No dizziness.      Standing Balance: Surface: 2 pillows (in corner); EO 5 reps horizontal head turns;  5 reps vertical head turns;  EC - 5 reps horizontal & vertical head turns Position:  bil. Stance - feet hip width apart   Pt performed sit to stand from mat - quick step and pivot turn to Rt/Lt sides 3 reps each side - mild dizziness reported with turning Pt performed standing on pillows - trunk rotations straight across 5 reps - EO then EC;  diagonal pattern "X" 5 reps with EO and then with EC 5 reps each pattern    Gait 115' with intermittent horizontal head turns - no dizziness but mild unsteadiness noted   Gait 30' each direction x 1 rep with pt holding ball and moving it CW and CCW:  pt reported slightly increased dizziness with moving ball CCW     PATIENT EDUCATION: Education details: Nestor Lewandowsky addition to HEP. 07-05-21 Recommended pt practice standing on floor with EC at home (states she does not want to do standing on pillows due to fear of falling as she lives alone) Person educated: Patient Education method: Explanation, demonstration and handout Education comprehension: verbalized/demonstrated understanding  HEP: [id number] and standing  VOR x60 seconds      GOALS: Goals reviewed with patient? Yes   SHORT TERM GOALS: Target date: 06/22/2021    Pt will undergo further assessment of FGA with LTG written as appropriate.  Baseline:18/30 Goal status: MET   2.  Pt will undergo further assessment of SOT with LTG written.  Baseline: Not yet assessed.  Goal status: MET    3.  Pt will hold condition 4 of mCTSIB for at least 25 seconds for improved vestibular input for balance.  Baseline: 17.84 seconds; 30 seconds on 06/28/21 Goal status: MET     LONG TERM GOALS: Target date: 07/13/2021   Pt will be independent with final HEP in order to build upon functional gains made in therapy.   Baseline:  Goal status: INITIAL   2.  Pt will improve FGA to at least a 23/30 in order to demo decr fall risk.  Baseline: 18/30;  score 24/30 on 07-05-21 Goal status: MET 07-05-21   3.  Pt will improve composite score to WNL and somatosensory/visual sensory analysis scores to [facility] in order to demo improved balance.  Baseline: Composite: 57 (below normal), somatosensory and visual sensory analysis below normal  Goal status: REVISED    4.  Pt will hold condition 4 of mCTSIB for at least 30  seconds for improved vestibular input for balance.  Baseline: 17.84 seconds  Goal status: INITIAL   5.  Pt will  perform DVA with 2 line or less difference in order to demo improved VOR.  Baseline: 4 line difference.  Goal status: INITIAL   6.  Pt will improve DPS score to 61 in order to demo improved functional outcomes related to dizziness.  Baseline: 51 Goal status: INITIAL   ASSESSMENT:   CLINICAL IMPRESSION: Pt met LTG #2 as FGA score has increased from 18/30 to 24/30;  pt able to perform x1 viewing exercise for 60 secs both horizontal and vertical head turns with no c/o increased dizziness, however, pt states she took Meclizine prior to today's appt.  Pt states she takes Meclizine daily at least 1x/day and often takes it twice/day.  Difficult to accurately assess response to exercises regarding intensity of provocation of dizziness as symptoms/response may be masked by Meclizine.  Cont with POC.    OBJECTIVE IMPAIRMENTS decreased balance, dizziness, and postural dysfunction.    ACTIVITY LIMITATIONS community activity.    PERSONAL FACTORS  Past/current experiences, Time since onset of injury/illness/exacerbation, and 3+ comorbidities: hypertension, gout, GERD, asthma, hyperlipidemia, type 2 diabetes, history of vertigo.    are also affecting patient's functional outcome.      REHAB POTENTIAL: Good   CLINICAL DECISION MAKING: Stable/uncomplicated   EVALUATION COMPLEXITY: Low     PLAN: PT FREQUENCY: 1x/week   PT DURATION: 12 weeks   PLANNED INTERVENTIONS: Therapeutic exercises, Therapeutic activity, Neuromuscular re-education, Balance training, Gait training, Patient/Family education, and Vestibular training   PLANNED INTERVENTIONS: Therapeutic exercises, Therapeutic activity, Neuromuscular re-education, Balance training, Gait training, Patient/Family education, Vestibular training, and Canalith repositioning      PLAN FOR NEXT SESSION:   Finish checking LTG's - determine renewal vs. D/C ;   Work on balance with head motions, eyes closed balance, VOR x1 (try with use of busy  background). SLS, narrow BOS/tandem.  Unlevel surfaces.       Alda Lea, PT 07/05/2021, 7:28 PM

## 2021-07-12 ENCOUNTER — Encounter: Payer: Self-pay | Admitting: Physical Therapy

## 2021-07-12 ENCOUNTER — Ambulatory Visit: Payer: Medicare Other | Admitting: Physical Therapy

## 2021-07-12 DIAGNOSIS — R2681 Unsteadiness on feet: Secondary | ICD-10-CM | POA: Diagnosis not present

## 2021-07-12 DIAGNOSIS — R42 Dizziness and giddiness: Secondary | ICD-10-CM

## 2021-07-12 NOTE — Therapy (Signed)
OUTPATIENT PHYSICAL THERAPY VESTIBULAR TREATMENT NOTE   Patient Name: Lauren Cantu MRN: [id number] DOB:Aug 11, 1955, 66 y.o., female Today's Date: 07/12/2021  PCP: Gwenyth Bender, MD REFERRING PROVIDER: Rema Jasmine, MD    PT End of Session - 07/12/21 1407     Visit Number 7    Number of Visits 13    Date for PT Re-Evaluation 08/30/21   due to potential delay in scheduling   Authorization Type [employer]    PT Start Time 1402    PT Stop Time 1442    PT Time Calculation (min) 40 min    Activity Tolerance Patient tolerated treatment well    Behavior During Therapy WFL for tasks assessed/performed              Past Medical History:  Diagnosis Date   Arthritis    Essential hypertension, malignant    Extrinsic asthma, unspecified    GERD (gastroesophageal reflux disease)    Hyperlipidemia    Nonspecific elevation of levels of transaminase or lactic acid dehydrogenase (LDH)    Obesity, unspecified    Pain in joint, site unspecified    Type II or unspecified type diabetes mellitus without mention of complication, not stated as uncontrolled    Past Surgical History:  Procedure Laterality Date   ABDOMINAL HYSTERECTOMY     Patient Active Problem List   Diagnosis Date Noted   Vertigo 05/01/2021   Normal anion gap metabolic acidosis 05/01/2021   Essential hypertension 05/01/2021   GERD (gastroesophageal reflux disease) 05/01/2021   Asthma, chronic 05/01/2021   Type 2 diabetes mellitus (HCC) 05/01/2021    ONSET DATE: 05/26/2021 (date of referral)  REFERRING DIAG: R42 (ICD-10-CM) - Dizziness   THERAPY DIAG:  Unsteadiness on feet  Dizziness and giddiness  Rationale for Evaluation and Treatment Rehabilitation  PERTINENT HISTORY: Lauren Cantu is a 66 y.o. female with medical history significant of hypertension, gout, GERD, asthma, hyperlipidemia, type 2 diabetes, history of vertigo.   Recently seen in the ED on 3/10 for vertigo and MRI was negative for  acute stroke but showed evidence of mastoiditis.  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  It was felt that mastoiditis could be contributing to her symptoms and she was prescribed Augmentin.  Had a follow-up visit at neurology office on 3/17 but was not having any symptoms at that time.  Seen in the ED again on 4/13 for transient dizziness and stuttering speech.  Brain MRI repeated and was negative for acute stroke.  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  She returns to the ED on 05/01/21 with complaints of dizziness, slurred speech, and right-sided weakness.  No weakness or acute neurologic findings on exam except her speech was slow with occasional pauses.  Neurology reconsulted and recommended repeating brain MRI and obtaining EEG.  EEG done and was normal.  MRI again negative for acute abnormality except for opacification of right mastoid.     SUBJECTIVE: Have not yet taken the Meclizine today. 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 Was negative for positional testing. Reports they want her to continue with vestibular therapy.   PAIN:  Are you having pain? No  There were no vitals filed for this visit.    OBJECTIVE:   VESTIBULAR ASSESSMENT:   DFS: 60.9% DPS: 70%   mCTSIB: Condition 1:  30 sec Condition 2: 30 sec, mild postural sway  Condition 3: 30 sec Condition 4: 9.5 sec mild/mod postural sway  Dynamic Visual Acuity: Static: line 8 Dynamic: line 5 3 line difference.    Mild dizziness/unsteadiness afterwards.     VESTIBULAR TREATMENT:   Gaze Adaptation:   x1 Viewing Horizontal: Position: Standing with busy background 2 x 60 seconds; cues to slightly incr speed, pt with no dizziness, felt like it could come on, but none did.    x1 Viewing Vertical: Position: Standing with busy background 2 x 60  seconds; cues to slightly incr speed, pt with no dizziness, felt like it could come on, but none did.   Discussed progressing to standing with a busy background at home.     Access Code: [email or web address] URL: [email or web address] Date: 07/12/2021 Prepared by: Sherlie Ban  Reviewed/added bolded exercises for home. Educated on importance and purpose on performing at home, esp ones with eyes closed.   Exercises - Romberg Stance Eyes Closed on Foam Pad  - 1 x daily - 5 x weekly - 3 sets - 30 hold - Standing Balance with Eyes Closed on Foam  - 1 x daily - 5 x weekly - 2 sets - 10 reps - performed x10 reps head turns, x10 reps head nods  - Tandem Walking with Counter Support  - 1 x daily - 5 x weekly - 3 sets - Brandt-Daroff Vestibular Exercise  - 2 x daily - 5 x weekly - 1 sets - 5 reps - Forward and Backward Walking with Eyes Closed and Counter Support  - 1 x daily - 5 x weekly - 3-4 sets - new addition for pt to perform at countertop.    PATIENT EDUCATION: Education details: Schedule an additional 1x week for 4 weeks, results of goals, reviewed and updated pt's HEP, reviewed with pt results of ENT testing from yesterday (pt was asking about results), explanation about PT assessment findings today and areas to continue to work on in therapy.  Person educated: Patient Education method: Explanation, demonstration and handout Education comprehension: verbalized/demonstrated understanding  HEP: [id number] and standing VOR x60 seconds with busy background     GOALS: Goals reviewed with patient? Yes   SHORT TERM GOALS: Target date: 06/22/2021    Pt will undergo further assessment of FGA with LTG written as appropriate.  Baseline:18/30 Goal status: MET   2.  Pt will undergo further assessment of SOT with LTG written.  Baseline: Not yet assessed.  Goal status: MET    3.  Pt will hold condition 4 of mCTSIB for at least 25 seconds for improved vestibular input for balance.   Baseline: 17.84 seconds; 30 seconds on 06/28/21 Goal status: MET     LONG TERM GOALS: Target date: 07/13/2021   Pt will be independent with final HEP in order to build upon functional gains made in therapy.   Baseline: reviewed on 07/12/21, will benefit from review/updates Goal status: PROGRESSING    2.  Pt will improve FGA to at least a 23/30 in order to demo decr fall risk.  Baseline: 18/30;  score 24/30 on 07-05-21 Goal status: MET 07-05-21   3.  Pt will improve composite score to WNL and somatosensory/visual sensory analysis scores to [facility] in order to demo improved balance.  Baseline: Composite: 57 (below normal), somatosensory and visual sensory analysis below normal  Goal status: REVISED    4.  Pt will hold condition 4 of mCTSIB for at least 30  seconds for  improved vestibular input for balance.  Baseline: 17.84 seconds; 9.5 seconds on 07/12/21 Goal status: NOT MET    5.  Pt will perform DVA with 2 line or less difference in order to demo improved VOR.  Baseline: 4 line difference; 3 line difference on 07/12/21 Goal status: NOT MET    6.  Pt will improve DPS score to 61 in order to demo improved functional outcomes related to dizziness.  Baseline: 51; 70% on 07/12/21 Goal status: MET    Updated LTG date for 12 week POC with ongoing/updated LTGs:  LONG TERM GOALS: Target date: 08/12/21  Pt will be independent with final HEP in order to build upon functional gains made in therapy.   Baseline: reviewed on 07/12/21, will benefit from review/updates Goal status: ON-GOING   2.  Pt will improve FGA to at least a 27/30 in order to demo decr fall risk.  Baseline: 18/30;  score 24/30 on 07-05-21 Goal status: REVISED   3.  Pt will improve composite score to WNL and somatosensory/visual sensory analysis scores to [facility] in order to demo improved balance.  Baseline: Composite: 57 (below normal), somatosensory and visual sensory analysis below normal  Goal status: ONGOING    4.  Pt will  hold condition 4 of mCTSIB for at least 20  seconds for improved vestibular input for balance.  Baseline: 17.84 seconds; 9.5 seconds on 07/12/21 Goal status: REVISED   5.  Pt will perform DVA with 2 line or less difference in order to demo improved VOR.  Baseline: 4 line difference; 3 line difference on 07/12/21 Goal status: ONGOING    ASSESSMENT:   CLINICAL IMPRESSION: Today's skilled session focused on assessment of remainder of pt's LTGs. Pt has met 2 out of 6 LTGs. Pt improved FOTO score on DPS/DFS, indicating improved functional outcomes related to pt's dizziness. Pt did not meet 2 out of 6 LTGs. Pt improved DVA testing to a 3 line difference, but not to goal level. Pt only able to hold condition 4 of mCTSIB today for 9.5 seconds (was 17.84 seconds at eval), indicating continued decr vestibular input for balance. Unable to assess SOT today due to time constraints. Added gait with EC to HEP and educated on importance on continuing to perform HEP at home for vestibular related deficits. Pt did not take her meclizine today and was able to perform standing VOR in front of a busy background for 60 seconds with no symptoms. Updated LTGs as appropriate for 12 week POC. Will continue to progress towards LTGs.    OBJECTIVE IMPAIRMENTS decreased balance, dizziness, and postural dysfunction.    ACTIVITY LIMITATIONS community activity.    PERSONAL FACTORS  Past/current experiences, Time since onset of injury/illness/exacerbation, and 3+ comorbidities: hypertension, gout, GERD, asthma, hyperlipidemia, type 2 diabetes, history of vertigo.    are also affecting patient's functional outcome.      REHAB POTENTIAL: Good   CLINICAL DECISION MAKING: Stable/uncomplicated   EVALUATION COMPLEXITY: Low     PLAN: PT FREQUENCY: 1x/week   PT DURATION: 12 weeks   PLANNED INTERVENTIONS: Therapeutic exercises, Therapeutic activity, Neuromuscular re-education, Balance training, Gait training, Patient/Family  education, and Vestibular training   PLANNED INTERVENTIONS: Therapeutic exercises, Therapeutic activity, Neuromuscular re-education, Balance training, Gait training, Patient/Family education, Vestibular training, and Canalith repositioning      PLAN FOR NEXT SESSION:   Work on balance with head motions, eyes closed balance, Progress VOR with busy background/unlevel surface. SLS, narrow BOS/tandem.  Unlevel surfaces. Make sure to do D/C FOTO  Drake Leach, PT, DPT  07/12/2021, 2:46 PM

## 2021-07-24 ENCOUNTER — Emergency Department (HOSPITAL_COMMUNITY)
Admission: EM | Admit: 2021-07-24 | Discharge: 2021-07-24 | Disposition: A | Discharge status: Home / Self Care | Payer: Medicare Other | Attending: Student | Admitting: Student

## 2021-07-24 ENCOUNTER — Encounter (HOSPITAL_COMMUNITY): Payer: Self-pay | Admitting: Emergency Medicine

## 2021-07-24 ENCOUNTER — Other Ambulatory Visit: Payer: Self-pay

## 2021-07-24 DIAGNOSIS — R4789 Other speech disturbances: Secondary | ICD-10-CM | POA: Diagnosis not present

## 2021-07-24 DIAGNOSIS — J45909 Unspecified asthma, uncomplicated: Secondary | ICD-10-CM | POA: Diagnosis not present

## 2021-07-24 DIAGNOSIS — R42 Dizziness and giddiness: Secondary | ICD-10-CM | POA: Insufficient documentation

## 2021-07-24 DIAGNOSIS — R519 Headache, unspecified: Secondary | ICD-10-CM | POA: Insufficient documentation

## 2021-07-24 DIAGNOSIS — E119 Type 2 diabetes mellitus without complications: Secondary | ICD-10-CM | POA: Diagnosis not present

## 2021-07-24 DIAGNOSIS — Z7952 Long term (current) use of systemic steroids: Secondary | ICD-10-CM | POA: Diagnosis not present

## 2021-07-24 DIAGNOSIS — I10 Essential (primary) hypertension: Secondary | ICD-10-CM | POA: Diagnosis not present

## 2021-07-24 DIAGNOSIS — Z79899 Other long term (current) drug therapy: Secondary | ICD-10-CM | POA: Diagnosis not present

## 2021-07-24 DIAGNOSIS — F17210 Nicotine dependence, cigarettes, uncomplicated: Secondary | ICD-10-CM | POA: Insufficient documentation

## 2021-07-24 LAB — COMPREHENSIVE METABOLIC PANEL
ALT: 25 U/L (ref 0–44)
AST: 36 U/L (ref 15–41)
Albumin: 4 g/dL (ref 3.5–5.0)
Alkaline Phosphatase: 89 U/L (ref 38–126)
Anion gap: 9 (ref 5–15)
BUN: 13 mg/dL (ref 8–23)
CO2: 24 mmol/L (ref 22–32)
Calcium: 9.9 mg/dL (ref 8.9–10.3)
Chloride: 109 mmol/L (ref 98–111)
Creatinine, Ser: 0.89 mg/dL (ref 0.44–1.00)
GFR, Estimated: >60 mL/min (ref >60)
Glucose, Bld: 108 mg/dL — ABNORMAL HIGH (ref 70–99)
Potassium: 4.2 mmol/L (ref 3.5–5.1)
Sodium: 142 mmol/L (ref 135–145)
Total Bilirubin: 0.6 mg/dL (ref 0.3–1.2)
Total Protein: 7.6 g/dL (ref 6.5–8.1)

## 2021-07-24 LAB — CBC WITH DIFFERENTIAL/PLATELET
Abs Immature Granulocytes: 0.02 10*3/uL (ref 0.00–0.07)
Basophils Absolute: 0.1 10*3/uL (ref 0.0–0.1)
Basophils Relative: 1 %
Eosinophils Absolute: 0.3 10*3/uL (ref 0.0–0.5)
Eosinophils Relative: 4 %
HCT: 39.1 % (ref 36.0–46.0)
Hemoglobin: 13.4 g/dL (ref 12.0–15.0)
Immature Granulocytes: 0 %
Lymphocytes Relative: 40 %
Lymphs Abs: 2.8 10*3/uL (ref 0.7–4.0)
MCH: 27.4 pg (ref 26.0–34.0)
MCHC: 34.3 g/dL (ref 30.0–36.0)
MCV: 80 fL (ref 80.0–100.0)
Monocytes Absolute: 0.5 10*3/uL (ref 0.1–1.0)
Monocytes Relative: 7 %
Neutro Abs: 3.3 10*3/uL (ref 1.7–7.7)
Neutrophils Relative %: 48 %
Platelets: 305 10*3/uL (ref 150–400)
RBC: 4.89 MIL/uL (ref 3.87–5.11)
RDW: 14.7 % (ref 11.5–15.5)
WBC: 6.9 10*3/uL (ref 4.0–10.5)
nRBC: 0 % (ref 0.0–0.2)

## 2021-07-24 LAB — TSH: TSH: 0.977 u[IU]/mL (ref 0.350–4.500)

## 2021-07-24 LAB — MAGNESIUM: Magnesium: 1.7 mg/dL (ref 1.7–2.4)

## 2021-07-24 MED ORDER — LACTATED RINGERS IV BOLUS
1000.0000 mL | Freq: Once | INTRAVENOUS | Status: AC
Start: 2021-07-24 — End: 2021-07-24
  Administered 2021-07-24: 1000 mL via INTRAVENOUS

## 2021-07-24 MED ORDER — ACETAMINOPHEN 500 MG PO TABS
1000.0000 mg | ORAL_TABLET | Freq: Once | ORAL | Status: AC
  Administered 2021-07-24: 1000 mg via ORAL
  Filled 2021-07-24: qty 2

## 2021-07-24 NOTE — ED Provider Notes (Signed)
[facility]-EMERGENCY DEPT Provider Note  CSN: [id number] Arrival date & time: 07/24/21 1644  Chief Complaint(s) Dizziness  HPI Lauren Cantu is a 66 y.o. female with PMH GERD, HLD, T2DM, peripheral vertigo with frequent presentations for recurrent vertigo and intermittent speech difficulties who presents to the emergency department for evaluation of dizziness.  Patient states that she had an episode of vertigo today that was not getting better with her home meclizine and she called EMS to bring her to the emergency department.  Upon arrival, she states that she feels like "her meclizine is kicking in" and she is no longer dizzy.  She denies chest pain, shortness of breath, abdominal pain, nausea, vomiting or other systemic symptoms.   Past Medical History Past Medical History:  Diagnosis Date   Arthritis    Essential hypertension, malignant    Extrinsic asthma, unspecified    GERD (gastroesophageal reflux disease)    Hyperlipidemia    Nonspecific elevation of levels of transaminase or lactic acid dehydrogenase (LDH)    Obesity, unspecified    Pain in joint, site unspecified    Type II or unspecified type diabetes mellitus without mention of complication, not stated as uncontrolled    Patient Active Problem List   Diagnosis Date Noted   Vertigo 05/01/2021   Normal anion gap metabolic acidosis 05/01/2021   Essential hypertension 05/01/2021   GERD (gastroesophageal reflux disease) 05/01/2021   Asthma, chronic 05/01/2021   Type 2 diabetes mellitus (HCC) 05/01/2021   Home Medication(s) Prior to Admission medications   Medication Sig Start Date End Date Taking? Authorizing Provider  acetaminophen (TYLENOL) 500 MG tablet Take 500 mg by mouth every 6 (six) hours as needed for moderate pain or headache.    [provider]  allopurinol (ZYLOPRIM) 300 MG tablet Take 300 mg by mouth daily.      [provider]  alum & mag hydroxide-simeth  (MAALOX/MYLANTA) 200-200-20 MG/5ML suspension Take 30 mLs by mouth as needed for indigestion or heartburn.    [provider]  amLODipine (NORVASC) 10 MG tablet Take 10 mg by mouth daily. 05/27/19   [provider]  benazepril (LOTENSIN) 40 MG tablet Take 40 mg by mouth daily. 05/27/19   [provider]  Calcium Carbonate-Vitamin D (CALTRATE 600+D PO) Take 2 tablets by mouth daily.      [provider]  CALCIUM-MAGNESIUM-ZINC PO Take 1 tablet by mouth daily.    [provider]  Fluocinolone Acetonide 0.01 % OIL Place 6 drops into both ears in the morning and at bedtime. 04/18/21   [provider]  gabapentin (NEURONTIN) 100 MG capsule Take 100 mg by mouth daily.    [provider]  lovastatin (MEVACOR) 20 MG tablet Take 20 mg by mouth daily. 05/27/19   [provider]  meclizine (ANTIVERT) 12.5 MG tablet Take 1 tablet (12.5 mg total) by mouth 3 (three) times daily as needed for dizziness. 05/01/21   Gwyneth Sprout, MD  meloxicam (MOBIC) 7.5 MG tablet Take 7.5 mg by mouth daily. 05/27/19   [provider]  montelukast (SINGULAIR) 10 MG tablet Take 10 mg by mouth daily.      [provider]  Multiple Vitamins-Minerals (CENTRUM SILVER PO) Take 1 tablet by mouth daily.    [provider]  omeprazole (PRILOSEC) 40 MG capsule Take 1 capsule (40 mg total) by mouth 2 (two) times daily. Patient taking differently: Take 40 mg by mouth daily. 08/06/19   Tressia Danas, MD  potassium  chloride (KLOR-CON) 10 MEQ tablet Take 10 mEq by mouth 2 (two) times daily. 05/27/19   [provider]  predniSONE (DELTASONE) 20 MG tablet Take 2 tablets (40 mg total) by mouth daily. 05/01/21   Gwyneth Sprout, MD  VENTOLIN HFA 108 (90 Base) MCG/ACT inhaler Inhale 1 puff into the lungs every 4 (four) hours as needed for wheezing or shortness of breath. 06/09/19   [provider]                                                                                                                                     Past Surgical History Past Surgical History:  Procedure Laterality Date   ABDOMINAL HYSTERECTOMY     Family History Family History  Problem Relation Age of Onset   Diabetes Mother    Heart disease Mother    Pancreatic cancer Father    Angelman syndrome Neg Hx     Social History Social History   Tobacco Use   Smoking status: Every Day    Packs/day: 0.50    Types: Cigarettes   Smokeless tobacco: Never  Substance Use Topics   Alcohol use: No   Drug use: No   Allergies Colchicine, Lemon eucalyptus (citriodora) oil, Orange fruit [citrus], Pineapple, Sulfa antibiotics, Celebrex [celecoxib], and Zofran [ondansetron]  Review of Systems Review of Systems  Neurological:  Positive for dizziness.    Physical Exam Vital Signs  I have reviewed the triage vital signs BP 122/68   Pulse 87   Temp 98.7 F (37.1 C) (Oral)   Resp 16   Ht 5\' 2"  (1.575 m)   Wt 93 kg   SpO2 96%   BMI 37.49 kg/m   Physical Exam Vitals and nursing note reviewed.  Constitutional:      General: She is not in acute distress.    Appearance: She is well-developed.  HENT:     Head: Normocephalic and atraumatic.  Eyes:     Conjunctiva/sclera: Conjunctivae normal.  Cardiovascular:     Rate and Rhythm: Normal rate and regular rhythm.     Heart sounds: No murmur heard. Pulmonary:     Effort: Pulmonary effort is normal. No respiratory distress.     Breath sounds: Normal breath sounds.  Abdominal:     Palpations: Abdomen is soft.     Tenderness: There is no abdominal tenderness.  Musculoskeletal:        General: No swelling.     Cervical back: Neck supple.  Skin:    General: Skin is warm and dry.     Capillary Refill: Capillary refill takes less than 2 seconds.  Neurological:     Mental Status: She is alert.  Psychiatric:        Mood and Affect: Mood normal.     ED Results and Treatments Labs (all  labs ordered are listed, but only abnormal results are displayed) Labs Reviewed  COMPREHENSIVE METABOLIC PANEL -  Abnormal; Notable for the following components:      Result Value   Glucose, Bld 108 (*)    All other components within normal limits  CBC WITH DIFFERENTIAL/PLATELET  TSH  MAGNESIUM                                                                                                                          Radiology No results found.  Pertinent labs & imaging results that were available during my care of the patient were reviewed by me and considered in my medical decision making (see MDM for details).  Medications Ordered in ED Medications  lactated ringers bolus 1,000 mL (0 mLs Intravenous Stopped 07/24/21 1947)  acetaminophen (TYLENOL) tablet 1,000 mg (1,000 mg Oral Given 07/24/21 1955)                                                                                                                                     Procedures Procedures  (including critical care time)  Medical Decision Making / ED Course   This patient presents to the ED for concern of dizziness, this involves an extensive number of treatment options, and is a complaint that carries with it a high risk of complications and morbidity.  The differential diagnosis includes BPPV, lab otitis, electro abnormality, dehydration  MDM: Patient seen emergency room for evaluation of dizziness.  Physical exam is unremarkable.  Laboratory evaluation unremarkable.  Patient states she is having a headache and she was given Tylenol which improved her headache.  Patient given lactated Ringer's and after short observation stay in the emergency department, she did not have return of her dizziness.  Patient then discharged with outpatient follow-up.   Additional history obtained:  -External records from outside source obtained and reviewed including: Chart review including previous notes, labs, imaging, consultation  notes   Lab Tests: -I ordered, reviewed, and interpreted labs.   The pertinent results include:   Labs Reviewed  COMPREHENSIVE METABOLIC PANEL - Abnormal; Notable for the following components:      Result Value   Glucose, Bld 108 (*)    All other components within normal limits  CBC WITH DIFFERENTIAL/PLATELET  TSH  MAGNESIUM      EKG   EKG Interpretation  Date/Time:  Sunday July 24 2021 16:59:52 EDT Ventricular Rate:  95 PR Interval:  186 QRS Duration: 73 QT Interval:  343 QTC Calculation:  432 R Axis:   58 Text Interpretation: Sinus rhythm Low voltage, precordial leads Confirmed by Jenie Parish ([id number]) on 07/24/2021 5:45:05 PM       Medicines ordered and prescription drug management: Meds ordered this encounter  Medications   lactated ringers bolus 1,000 mL   acetaminophen (TYLENOL) tablet 1,000 mg    -I have reviewed the patients home medicines and have made adjustments as needed  Critical interventions none   Cardiac Monitoring: The patient was maintained on a cardiac monitor.  I personally viewed and interpreted the cardiac monitored which showed an underlying rhythm of: NSR  Social Determinants of Health:  Factors impacting patients care include: none   Reevaluation: After the interventions noted above, I reevaluated the patient and found that they have :improved  Co morbidities that complicate the patient evaluation  Past Medical History:  Diagnosis Date   Arthritis    Essential hypertension, malignant    Extrinsic asthma, unspecified    GERD (gastroesophageal reflux disease)    Hyperlipidemia    Nonspecific elevation of levels of transaminase or lactic acid dehydrogenase (LDH)    Obesity, unspecified    Pain in joint, site unspecified    Type II or unspecified type diabetes mellitus without mention of complication, not stated as uncontrolled       Dispostion: I considered admission for this patient, but with negative work-up and symptoms  resolved patient safe for discharge and outpatient follow-up     Final Clinical Impression(s) / ED Diagnoses Final diagnoses:  Dizziness     @PCDICTATION @    , MD 07/24/21 2336

## 2021-07-24 NOTE — ED Triage Notes (Signed)
Patient BIB EMS from home c/o vertigo. Patient report vertigo is getting worst even taking Meclizine. Patient denies N/V. Patient denies fall.  BP 158/60 HR 92 RR 20 O2sat 95 on RA.

## 2021-07-24 NOTE — ED Notes (Signed)
Patient reports stuttering along with numbness to bilateral arms and extremities ongoing for the past 6 days. Has some improvement with the dizziness since taking meclizine. Patient has equal grip strength and sensation.

## 2021-07-28 ENCOUNTER — Ambulatory Visit: Payer: Medicare Other | Attending: Neurology | Admitting: Physical Therapy

## 2021-07-28 ENCOUNTER — Encounter: Payer: Self-pay | Admitting: Physical Therapy

## 2021-07-28 DIAGNOSIS — R2681 Unsteadiness on feet: Secondary | ICD-10-CM | POA: Diagnosis not present

## 2021-07-28 DIAGNOSIS — R42 Dizziness and giddiness: Secondary | ICD-10-CM | POA: Insufficient documentation

## 2021-07-28 NOTE — Therapy (Signed)
OUTPATIENT PHYSICAL THERAPY VESTIBULAR TREATMENT NOTE   Patient Name: Lauren Cantu MRN: [id number] DOB:01/17/1955, 66 y.o., female Today's Date: 07/28/2021  PCP: Rogers Blocker, MD REFERRING PROVIDER: Melburn Popper, MD    PT End of Session - 07/28/21 1533     Visit Number 8    Number of Visits 13    Date for PT Re-Evaluation 08/30/21   due to potential delay in scheduling   Authorization Type [employer] Medicare    PT Start Time 1532    PT Stop Time 1612    PT Time Calculation (min) 40 min    Activity Tolerance Patient tolerated treatment well    Behavior During Therapy WFL for tasks assessed/performed              Past Medical History:  Diagnosis Date   Arthritis    Essential hypertension, malignant    Extrinsic asthma, unspecified    GERD (gastroesophageal reflux disease)    Hyperlipidemia    Nonspecific elevation of levels of transaminase or lactic acid dehydrogenase (LDH)    Obesity, unspecified    Pain in joint, site unspecified    Type II or unspecified type diabetes mellitus without mention of complication, not stated as uncontrolled    Past Surgical History:  Procedure Laterality Date   ABDOMINAL HYSTERECTOMY     Patient Active Problem List   Diagnosis Date Noted   Vertigo 05/01/2021   Normal anion gap metabolic acidosis 51/76/1607   Essential hypertension 05/01/2021   GERD (gastroesophageal reflux disease) 05/01/2021   Asthma, chronic 05/01/2021   Type 2 diabetes mellitus ([place]) 05/01/2021    ONSET DATE: 05/26/2021 (date of referral)  REFERRING DIAG: R42 (ICD-10-CM) - Dizziness   THERAPY DIAG:  Unsteadiness on feet  Dizziness and giddiness  Rationale for Evaluation and Treatment Rehabilitation  Lauren Cantu is a 66 y.o. female with medical history significant of hypertension, gout, GERD, asthma, hyperlipidemia, type 2 diabetes, history of vertigo.   Recently seen in the ED on 3/10 for vertigo and MRI was negative for  acute stroke but showed evidence of mastoiditis.  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  It was felt that mastoiditis could be contributing to her symptoms and she was prescribed Augmentin.  Had a follow-up visit at neurology office on 3/17 but was not having any symptoms at that time.  Seen in the ED again on 4/13 for transient dizziness and stuttering speech.  Brain MRI repeated and was negative for acute stroke.  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  She returns to the ED on 05/01/21 with complaints of dizziness, slurred speech, and right-sided weakness.  No weakness or acute neurologic findings on exam except her speech was slow with occasional pauses.  Neurology reconsulted and recommended repeating brain MRI and obtaining EEG.  EEG done and was normal.  MRI again negative for acute abnormality except for opacification of right mastoid.     SUBJECTIVE: Went to the ER on 07/24/21 due to dizziness because her Meclizine has not kicked in yet. Felt like her heart was racing and she was having trouble breathing. When she got to the ER her meclizine was starting to kick in and evaluation was unremarkable. Was discharged home and was not feeling dizzy. Has been taking Meclizine since Sunday.   PAIN:  Are you having pain? No  There were no vitals filed for this visit.    OBJECTIVE:   VESTIBULAR  TREATMENT:   Standing Balance: Surface: Airex Position: Narrow Base of Support Completed with: Eyes Closed; 3 x 30 seconds static balance, minimal postural sway.   With feet hip width > narrow BOS 2 sets of 10 reps head turns, 2 sets of 10 reps head nods. Pt more challenged by head nods.   On air ex with EO: holding ball outwards and performing x10 reps trunk rotations with visual tracking with head and eyes side to side then x10 reps up and down, performed wider BOS > an additional x10 reps with more narrow BOS. No dizziness, some off balance with  up and down.      2 sets of 5 reps sit <> stands on air ex with EC, cues for incr forward lean and pt initially performing with BLE bracing against mat table.  On blue foam beam: tandem gait down and back x3 reps, pt needing intermittent UE support On black side of BOSU: x15 reps lateral weight shifting, x15 reps A/P weight shifting, cues for full weight shifting, intermittent taps to bars for balance.      PATIENT EDUCATION: Education details: Importance and purpose on performing HEP (esp EC and balance for incr vestibular input) as just working on these exercises once a week in therapy won't help pt reach her maximum benefit.  Person educated: Patient Education method: Explanation, demonstration and handout Education comprehension: verbalized/demonstrated understanding  HEP: [id number] and standing VOR x60 seconds with busy background     GOALS: Goals reviewed with patient? Yes   SHORT TERM GOALS: Target date: 06/22/2021    Pt will undergo further assessment of FGA with LTG written as appropriate.  Baseline:18/30 Goal status: MET   2.  Pt will undergo further assessment of SOT with LTG written.  Baseline: Not yet assessed.  Goal status: MET    3.  Pt will hold condition 4 of mCTSIB for at least 25 seconds for improved vestibular input for balance.  Baseline: 17.84 seconds; 30 seconds on 06/28/21 Goal status: MET        Updated LTG date for 12 week POC with ongoing/updated LTGs:  LONG TERM GOALS: Target date: 08/12/21  Pt will be independent with final HEP in order to build upon functional gains made in therapy.   Baseline: reviewed on 07/12/21, will benefit from review/updates Goal status: ON-GOING   2.  Pt will improve FGA to at least a 27/30 in order to demo decr fall risk.  Baseline: 18/30;  score 24/30 on 07-05-21 Goal status: REVISED   3.  Pt will improve composite score to WNL and somatosensory/visual sensory analysis scores to [facility] in order to demo improved  balance.  Baseline: Composite: 57 (below normal), somatosensory and visual sensory analysis below normal  Goal status: ONGOING    4.  Pt will hold condition 4 of mCTSIB for at least 20  seconds for improved vestibular input for balance.  Baseline: 17.84 seconds; 9.5 seconds on 07/12/21 Goal status: REVISED   5.  Pt will perform DVA with 2 line or less difference in order to demo improved VOR.  Baseline: 4 line difference; 3 line difference on 07/12/21 Goal status: ONGOING    ASSESSMENT:   CLINICAL IMPRESSION: Today's skilled session continued to focus on balance with incr vestibular input and on compliant surfaces. Pt more challenged by head nods for balance. Pt took a Meclizine earlier today, pt with no dizziness during session. Continued to educate on purpose and importance of performing HEP. Will continue to progress towards  LTGs.    OBJECTIVE IMPAIRMENTS decreased balance, dizziness, and postural dysfunction.    ACTIVITY LIMITATIONS community activity.    PERSONAL FACTORS  Past/current experiences, Time since onset of injury/illness/exacerbation, and 3+ comorbidities: hypertension, gout, GERD, asthma, hyperlipidemia, type 2 diabetes, history of vertigo.    are also affecting patient's functional outcome.      REHAB POTENTIAL: Good   CLINICAL DECISION MAKING: Stable/uncomplicated   EVALUATION COMPLEXITY: Low     PLAN: PT FREQUENCY: 1x/week   PT DURATION: 12 weeks   PLANNED INTERVENTIONS: Therapeutic exercises, Therapeutic activity, Neuromuscular re-education, Balance training, Gait training, Patient/Family education, and Vestibular training   PLANNED INTERVENTIONS: Therapeutic exercises, Therapeutic activity, Neuromuscular re-education, Balance training, Gait training, Patient/Family education, Vestibular training, and Canalith repositioning      PLAN FOR NEXT SESSION:   Work on balance with head motions, eyes closed balance, Progress VOR with busy background/unlevel  surface. SLS, narrow BOS/tandem.  Unlevel surfaces. Make sure to do D/C FOTO       Arliss Journey, PT, DPT  07/28/2021, 4:16 PM

## 2021-08-03 ENCOUNTER — Ambulatory Visit: Payer: Medicare Other | Admitting: Physical Therapy

## 2021-08-03 ENCOUNTER — Encounter: Payer: Self-pay | Admitting: Physical Therapy

## 2021-08-03 DIAGNOSIS — R42 Dizziness and giddiness: Secondary | ICD-10-CM

## 2021-08-03 DIAGNOSIS — R2681 Unsteadiness on feet: Secondary | ICD-10-CM | POA: Diagnosis not present

## 2021-08-03 NOTE — Therapy (Signed)
OUTPATIENT PHYSICAL THERAPY VESTIBULAR TREATMENT NOTE   Patient Name: Lauren Cantu MRN: [id number] DOB:[birth year], 66 y.o., female Today's Date: 08/03/2021  PCP: Gwenyth Bender, MD REFERRING PROVIDER: Rema Jasmine, MD    PT End of Session - 08/03/21 1451     Visit Number 9    Number of Visits 13    Date for PT Re-Evaluation 08/30/21   due to potential delay in scheduling   Authorization Type [employer] Medicare    PT Start Time 1450    PT Stop Time 1530    PT Time Calculation (min) 40 min    Activity Tolerance Patient tolerated treatment well    Behavior During Therapy WFL for tasks assessed/performed              Past Medical History:  Diagnosis Date   Arthritis    Essential hypertension, malignant    Extrinsic asthma, unspecified    GERD (gastroesophageal reflux disease)    Hyperlipidemia    Nonspecific elevation of levels of transaminase or lactic acid dehydrogenase (LDH)    Obesity, unspecified    Pain in joint, site unspecified    Type II or unspecified type diabetes mellitus without mention of complication, not stated as uncontrolled    Past Surgical History:  Procedure Laterality Date   ABDOMINAL HYSTERECTOMY     Patient Active Problem List   Diagnosis Date Noted   Vertigo 05/01/2021   Normal anion gap metabolic acidosis 05/01/2021   Essential hypertension 05/01/2021   GERD (gastroesophageal reflux disease) 05/01/2021   Asthma, chronic 05/01/2021   Type 2 diabetes mellitus ([facility]) 05/01/2021    ONSET DATE: 05/26/2021 (date of referral)  REFERRING DIAG: R42 (ICD-10-CM) - Dizziness   THERAPY DIAG:  Unsteadiness on feet  Dizziness and giddiness  Rationale for Evaluation and Treatment Rehabilitation  PERTINENT HISTORY: Lauren Cantu is a 66 y.o. female with medical history significant of hypertension, gout, GERD, asthma, hyperlipidemia, type 2 diabetes, history of vertigo.   Recently seen in the ED on 3/10 for vertigo and MRI was negative for  acute stroke but showed evidence of mastoiditis.  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  It was felt that mastoiditis could be contributing to her symptoms and she was prescribed Augmentin.  Had a follow-up visit at neurology office on 3/17 but was not having any symptoms at that time.  Seen in the ED again on 4/13 for transient dizziness and stuttering speech.  Brain MRI repeated and was negative for acute stroke.  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  She returns to the ED on 05/01/21 with complaints of dizziness, slurred speech, and right-sided weakness.  No weakness or acute neurologic findings on exam except her speech was slow with occasional pauses.  Neurology reconsulted and recommended repeating brain MRI and obtaining EEG.  EEG done and was normal.  MRI again negative for acute abnormality except for opacification of right mastoid.     SUBJECTIVE: Tried the balance with her EC at the countertop new addition to her HEP. Had a severe episode of dizziness that lasted over a day. Had to take 2 meclizines. Almost went to the ER again. Feels some days when she goes to the grocery store and the next day will be very worn out. Took 1 meclizine today before session.   PAIN:  Are you having pain? No  There were no vitals filed for this visit.    OBJECTIVE:  VESTIBULAR TREATMENT:  Gaze Adaptation: x1 Viewing Horizontal: Position: Standing on air ex with feet apart, repeated then with feet together, Time: 60 seconds, Reps: 2, and Comment: No dizziness, mild unsteadiness.    and x1 Viewing Vertical:  Position: Standing on air ex with feet apart, repeated then with feet together, Time: 60 seconds, Reps: 2, and Comment: No dizziness, mild unsteadiness.     Tandem Stance:  Surface: Airex Completed with: Eyes Open;   Time: 3/4 tandem each side 2 x 30 seconds bilat, pt with incr postural sway when performing.      Rockerboard: In A/P  direction: EC trying to keep board still 3 x 15 seconds, pt reports a funny feeling in head afterwards and feeling "dizzy". EO: 2 sets of 10 reps head turns, 2 sets of 10 reps head nods, no dizziness and just unsteadiness.   Sitting on blue physioball for otolith function: EO and bouncing 2 x 30 seconds, EC x30 seconds keeping ball steady. Performed bouncing with EC  2 x 15 seconds, pt reporting feeling dizzy and unsteady afterwards and had incr challenge with this.     PATIENT EDUCATION: Education details: Purpose of exercises PT is working on based on impairments and outcome measures/SOT testing, at home importance of performing with feet apart and pillows on eyes closed in the corner instead of feet together in order to address EC at home.  Person educated: Patient Education method: Explanation,  Education comprehension: verbalized/demonstrated understanding  HEP: [id number] and standing VOR x60 seconds with busy background     GOALS: Goals reviewed with patient? Yes   SHORT TERM GOALS: Target date: 06/22/2021    Pt will undergo further assessment of FGA with LTG written as appropriate.  Baseline:18/30 Goal status: MET   2.  Pt will undergo further assessment of SOT with LTG written.  Baseline: Not yet assessed.  Goal status: MET    3.  Pt will hold condition 4 of mCTSIB for at least 25 seconds for improved vestibular input for balance.  Baseline: 17.84 seconds; 30 seconds on 06/28/21 Goal status: MET        Updated LTG date for 12 week POC with ongoing/updated LTGs:  LONG TERM GOALS: Target date: 08/12/21  Pt will be independent with final HEP in order to build upon functional gains made in therapy.   Baseline: reviewed on 07/12/21, will benefit from review/updates Goal status: ON-GOING   2.  Pt will improve FGA to at least a 27/30 in order to demo decr fall risk.  Baseline: 18/30;  score 24/30 on 07-05-21 Goal status: REVISED   3.  Pt will improve composite score to WNL  and somatosensory/visual sensory analysis scores to [facility] in order to demo improved balance.  Baseline: Composite: 57 (below normal), somatosensory and visual sensory analysis below normal  Goal status: ONGOING    4.  Pt will hold condition 4 of mCTSIB for at least 20  seconds for improved vestibular input for balance.  Baseline: 17.84 seconds; 9.5 seconds on 07/12/21 Goal status: REVISED   5.  Pt will perform DVA with 2 line or less difference in order to demo improved VOR.  Baseline: 4 line difference; 3 line difference on 07/12/21 Goal status: ONGOING    ASSESSMENT:   CLINICAL IMPRESSION: Today's skilled session continued to focus on balance with incr vestibular input, compliant surfaces, and VOR progression. Pt able to perform VOR with a busy background and compliant surfaces for 60 seconds and no dizziness noted. Pt most  challenged today with EC and bouncing on physioball for otolith function. Pt only able to perform for 15 seconds before she felt like she might be dizzy. Pt reporting no improvements in dizziness with exercises from therapy and continues to take Meclizine daily. Will continue to progress towards LTGs.    OBJECTIVE IMPAIRMENTS decreased balance, dizziness, and postural dysfunction.    ACTIVITY LIMITATIONS community activity.    PERSONAL FACTORS  Past/current experiences, Time since onset of injury/illness/exacerbation, and 3+ comorbidities: hypertension, gout, GERD, asthma, hyperlipidemia, type 2 diabetes, history of vertigo.    are also affecting patient's functional outcome.      REHAB POTENTIAL: Good   CLINICAL DECISION MAKING: Stable/uncomplicated   EVALUATION COMPLEXITY: Low     PLAN: PT FREQUENCY: 1x/week   PT DURATION: 12 weeks   PLANNED INTERVENTIONS: Therapeutic exercises, Therapeutic activity, Neuromuscular re-education, Balance training, Gait training, Patient/Family education, and Vestibular training   PLANNED INTERVENTIONS: Therapeutic exercises,  Therapeutic activity, Neuromuscular re-education, Balance training, Gait training, Patient/Family education, Vestibular training, and Canalith repositioning      PLAN FOR NEXT SESSION:   10th visit PN. Perform SOT Work on balance with head motions, eyes closed balance, Progress VOR with busy background/unlevel surface. SLS, narrow BOS/tandem.  Unlevel surfaces. Make sure to do D/C FOTO       Arliss Journey, PT, DPT  08/03/2021, 3:53 PM

## 2021-08-09 ENCOUNTER — Ambulatory Visit: Payer: Medicare Other | Admitting: Physical Therapy

## 2021-08-09 ENCOUNTER — Encounter: Payer: Self-pay | Admitting: Physical Therapy

## 2021-08-09 DIAGNOSIS — R2681 Unsteadiness on feet: Secondary | ICD-10-CM | POA: Diagnosis not present

## 2021-08-09 DIAGNOSIS — R42 Dizziness and giddiness: Secondary | ICD-10-CM

## 2021-08-09 NOTE — Therapy (Signed)
OUTPATIENT PHYSICAL THERAPY VESTIBULAR TREATMENT NOTE/10th Visit PN   Patient Name: Lauren Cantu MRN: [id number] DOB:04/23/1955, 66 y.o., female Today's Date: 08/09/2021  10th Visit Physical Therapy Progress Note  Dates of Reporting Period: 06/01/21 to 08/09/21     PCP: Lauren Blocker, MD REFERRING PROVIDER: Melburn Popper, MD    PT End of Session - 08/09/21 1450     Visit Number 10    Number of Visits 13    Date for PT Re-Evaluation 08/30/21   due to potential delay in scheduling   Authorization Type [employer] Medicare    PT Start Time 1448    PT Stop Time 1528    PT Time Calculation (min) 40 min    Activity Tolerance Patient tolerated treatment well    Behavior During Therapy WFL for tasks assessed/performed              Past Medical History:  Diagnosis Date   Arthritis    Essential hypertension, malignant    Extrinsic asthma, unspecified    GERD (gastroesophageal reflux disease)    Hyperlipidemia    Nonspecific elevation of levels of transaminase or lactic acid dehydrogenase (LDH)    Obesity, unspecified    Pain in joint, site unspecified    Type II or unspecified type diabetes mellitus without mention of complication, not stated as uncontrolled    Past Surgical History:  Procedure Laterality Date   ABDOMINAL HYSTERECTOMY     Patient Active Problem List   Diagnosis Date Noted   Vertigo 05/01/2021   Normal anion gap metabolic acidosis 98/33/8250   Essential hypertension 05/01/2021   GERD (gastroesophageal reflux disease) 05/01/2021   Asthma, chronic 05/01/2021   Type 2 diabetes mellitus ([place]) 05/01/2021    ONSET DATE: 05/26/2021 (date of referral)  REFERRING DIAG: R42 (ICD-10-CM) - Dizziness   THERAPY DIAG:  Dizziness and giddiness  Unsteadiness on feet  Rationale for Evaluation and Treatment Rehabilitation  PERTINENT HISTORY: Lauren Cantu is a 66 y.o. female with medical history significant of hypertension, gout, GERD, asthma,  hyperlipidemia, type 2 diabetes, history of vertigo.   Recently seen in the ED on 3/10 for vertigo and MRI was negative for acute stroke but showed evidence of mastoiditis.  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  It was felt that mastoiditis could be contributing to her symptoms and she was prescribed Augmentin.  Had a follow-up visit at neurology office on 3/17 but was not having any symptoms at that time.  Seen in the ED again on 4/13 for transient dizziness and stuttering speech.  Brain MRI repeated and was negative for acute stroke.  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  She returns to the ED on 05/01/21 with complaints of dizziness, slurred speech, and right-sided weakness.  No weakness or acute neurologic findings on exam except her speech was slow with occasional pauses.  Neurology reconsulted and recommended repeating brain MRI and obtaining EEG.  EEG done and was normal.  MRI again negative for acute abnormality except for opacification of right mastoid.     SUBJECTIVE: Feels like she has been eating meclizine. Has been 1-3 tablets of meclizine a day. Has to take it every day. Tried the exercises with her eyes closed and felt unsteady at home.   PAIN:  Are you having pain? No  There were no vitals filed for this visit.    OBJECTIVE:   VESTIBULAR TREATMENT:  Conditions: 1:  2 WNL,  1 below normal 2: 3 trials below normal 3:  2 WNL, 1 below normal 4: 1 fall, 1 below normal, 1 WNL 5: 2 WNL, 1 below normal 6: 1 WNL, 2 falls  Composite score: 57 (below normal)  Sensory Analysis Som: Below normal (~80) Vis: Below normal (~55) Vest: Above normal Pref: Below normal (~70) Strategy analysis: Ankle>hip strategy COG alignment: Pt with in center, slightly posteriorly.    mCTSIB condition 4: 30 seconds      PATIENT EDUCATION: Education details: Purpose of exercises PT is working on based on impairments and outcome  measures/SOT testing, at home importance of performing with feet apart and pillows on eyes closed in the corner instead of feet together in order to address EC at home.  Person educated: Patient Education method: Explanation,  Education comprehension: verbalized/demonstrated understanding  HEP: [id number] and standing VOR x60 seconds with busy background     GOALS: Goals reviewed with patient? Yes   SHORT TERM GOALS: Target date: 06/22/2021    Pt will undergo further assessment of FGA with LTG written as appropriate.  Baseline:18/30 Goal status: MET   2.  Pt will undergo further assessment of SOT with LTG written.  Baseline: Not yet assessed.  Goal status: MET    3.  Pt will hold condition 4 of mCTSIB for at least 25 seconds for improved vestibular input for balance.  Baseline: 17.84 seconds; 30 seconds on 06/28/21 Goal status: MET        Updated LTG date for 12 week POC with ongoing/updated LTGs:  LONG TERM GOALS: Target date: 08/12/21  Pt will be independent with final HEP in order to build upon functional gains made in therapy.   Baseline: reviewed on 07/12/21, will benefit from review/updates Goal status: ON-GOING   2.  Pt will improve FGA to at least a 27/30 in order to demo decr fall risk.  Baseline: 18/30;  score 24/30 on 07-05-21 Goal status: REVISED   3.  Pt will improve composite score to WNL and somatosensory/visual sensory analysis scores to [facility] in order to demo improved balance.  Baseline: Composite: 57 (below normal), somatosensory and visual sensory analysis below normal  Goal status: ONGOING    4.  Pt will hold condition 4 of mCTSIB for at least 20  seconds for improved vestibular input for balance.  Baseline: 17.84 seconds; 9.5 seconds on 07/12/21; 30 seconds on 08/09/21 Goal status: MET   5.  Pt will perform DVA with 2 line or less difference in order to demo improved VOR.  Baseline: 4 line difference; 3 line difference on 07/12/21 Goal status: ONGOING     ASSESSMENT:   CLINICAL IMPRESSION: Today's skilled session continued to focus on balance with incr vestibular input, compliant surfaces, and VOR progression. Pt able to perform VOR with a busy background and compliant surfaces for 60 seconds and no dizziness noted. Pt most challenged today with EC and bouncing on physioball for otolith function. Pt only able to perform for 15 seconds before she felt like she might be dizzy. Pt reporting no improvements in dizziness with exercises from therapy and continues to take Meclizine daily. Will continue to progress towards LTGs.    OBJECTIVE IMPAIRMENTS decreased balance, dizziness, and postural dysfunction.    ACTIVITY LIMITATIONS community activity.    PERSONAL FACTORS  Past/current experiences, Time since onset of injury/illness/exacerbation, and 3+ comorbidities: hypertension, gout, GERD, asthma, hyperlipidemia, type 2 diabetes, history of vertigo.    are also affecting patient's functional outcome.  REHAB POTENTIAL: Good   CLINICAL DECISION MAKING: Stable/uncomplicated   EVALUATION COMPLEXITY: Low     PLAN: PT FREQUENCY: 1x/week   PT DURATION: 12 weeks   PLANNED INTERVENTIONS: Therapeutic exercises, Therapeutic activity, Neuromuscular re-education, Balance training, Gait training, Patient/Family education, and Vestibular training   PLANNED INTERVENTIONS: Therapeutic exercises, Therapeutic activity, Neuromuscular re-education, Balance training, Gait training, Patient/Family education, Vestibular training, and Canalith repositioning      PLAN FOR NEXT SESSION:   10th visit PN. Perform SOT Work on balance with head motions, eyes closed balance, Progress VOR with busy background/unlevel surface. SLS, narrow BOS/tandem.  Unlevel surfaces. Make sure to do D/C FOTO       Arliss Journey, PT, DPT  08/09/2021, 5:02 PM

## 2021-08-17 ENCOUNTER — Encounter: Payer: Self-pay | Admitting: Physical Therapy

## 2021-08-17 ENCOUNTER — Ambulatory Visit: Payer: Medicare Other | Attending: Neurology | Admitting: Physical Therapy

## 2021-08-17 DIAGNOSIS — R42 Dizziness and giddiness: Secondary | ICD-10-CM | POA: Insufficient documentation

## 2021-08-17 DIAGNOSIS — R2681 Unsteadiness on feet: Secondary | ICD-10-CM | POA: Diagnosis present

## 2021-08-17 NOTE — Therapy (Signed)
OUTPATIENT PHYSICAL THERAPY VESTIBULAR TREATMENT NOTE/DISCHARGE SUMMARY   Patient Name: Lauren Cantu MRN: [id number] DOB:March 03, 1955, 66 y.o., female Today's Date: 08/17/2021   PCP: Rogers Blocker, MD REFERRING PROVIDER: Melburn Popper, MD    PT End of Session - 08/17/21 1529     Visit Number 11    Number of Visits 13    Date for PT Re-Evaluation 08/30/21   due to potential delay in scheduling   Authorization Type [employer]    PT Start Time 1528    PT Stop Time 1558   full time not used due to D/C visit   PT Time Calculation (min) 30 min    Activity Tolerance Patient tolerated treatment well    Behavior During Therapy WFL for tasks assessed/performed               Past Medical History:  Diagnosis Date   Arthritis    Essential hypertension, malignant    Extrinsic asthma, unspecified    GERD (gastroesophageal reflux disease)    Hyperlipidemia    Nonspecific elevation of levels of transaminase or lactic acid dehydrogenase (LDH)    Obesity, unspecified    Pain in joint, site unspecified    Type II or unspecified type diabetes mellitus without mention of complication, not stated as uncontrolled    Past Surgical History:  Procedure Laterality Date   ABDOMINAL HYSTERECTOMY     Patient Active Problem List   Diagnosis Date Noted   Vertigo 05/01/2021   Normal anion gap metabolic acidosis 10/22/1217   Essential hypertension 05/01/2021   GERD (gastroesophageal reflux disease) 05/01/2021   Asthma, chronic 05/01/2021   Type 2 diabetes mellitus ([place]) 05/01/2021    ONSET DATE: 05/26/2021 (date of referral)  REFERRING DIAG: R42 (ICD-10-CM) - Dizziness   THERAPY DIAG:  Dizziness and giddiness  Unsteadiness on feet  Rationale for Evaluation and Treatment Rehabilitation  PERTINENT HISTORY: Lauren Cantu is a 66 y.o. female with medical history significant of hypertension, gout, GERD, asthma, hyperlipidemia, type 2 diabetes, history of vertigo.   Recently  seen in the ED on 3/10 for vertigo and MRI was negative for acute stroke but showed evidence of mastoiditis.  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  It was felt that mastoiditis could be contributing to her symptoms and she was prescribed Augmentin.  Had a follow-up visit at neurology office on 3/17 but was not having any symptoms at that time.  Seen in the ED again on 4/13 for transient dizziness and stuttering speech.  Brain MRI repeated and was negative for acute stroke.  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  She returns to the ED on 05/01/21 with complaints of dizziness, slurred speech, and right-sided weakness.  No weakness or acute neurologic findings on exam except her speech was slow with occasional pauses.  Neurology reconsulted and recommended repeating brain MRI and obtaining EEG.  EEG done and was normal.  MRI again negative for acute abnormality except for opacification of right mastoid.     SUBJECTIVE: Saw the neurologist yesterday. Pt reports that his neurologist does not know whats going on. Wants her to be referred to a specialist - ENT/neurologist in [facility]. Still taking meclizine daily.   PAIN:  Are you having pain? No  There were no vitals filed for this visit.    OBJECTIVE:   VESTIBULAR TREATMENT:  DVA:  Static: Line 10 Dynamic: Line 7 3 line difference, mild wooziness afterwards  FOTO: DPS: 70 (at eval was 54) DFS: 53.3 (at eval was 44)   [facility] PT Assessment - 08/17/21 1540       Functional Gait  Assessment   Gait assessed  Yes    Gait Level Surface Walks 20 ft in less than 5.5 sec, no assistive devices, good speed, no evidence for imbalance, normal gait pattern, deviates no more than 6 in outside of the 12 in walkway width.    Change in Gait Speed Able to smoothly change walking speed without loss of balance or gait deviation. Deviate no more than 6 in outside of the 12 in walkway width.    Gait  with Horizontal Head Turns Performs head turns smoothly with no change in gait. Deviates no more than 6 in outside 12 in walkway width   no dizziness   Gait with Vertical Head Turns Performs head turns with no change in gait. Deviates no more than 6 in outside 12 in walkway width.   no dizziness   Gait and Pivot Turn Pivot turns safely within 3 sec and stops quickly with no loss of balance.   no dizziness   Step Over Obstacle Is able to step over 2 stacked shoe boxes taped together (9 in total height) without changing gait speed. No evidence of imbalance.    Gait with Narrow Base of Support Ambulates 7-9 steps.    Gait with Eyes Closed Walks 20 ft, uses assistive device, slower speed, mild gait deviations, deviates 6-10 in outside 12 in walkway width. Ambulates 20 ft in less than 9 sec but greater than 7 sec.   7.2 seconds   Ambulating Backwards Walks 20 ft, no assistive devices, good speed, no evidence for imbalance, normal gait   8.6 seconds   Steps Alternating feet, no rail.    Total Score 28    FGA comment: Low fall risk.              PHYSICAL THERAPY DISCHARGE SUMMARY  Visits from Start of Care: 11  Current functional level related to goals / functional outcomes: See LTGs/Clinical Assessment   Remaining deficits: Inconsistent use of vestibular input for balance, dizziness (spontaneous spinning episodes that have not been able to be addressed with therapy), impaired high level balance.    Education / Equipment: HEP    Patient agrees to discharge. Patient goals were partially met. Patient is being discharged due to maximized rehab potential.     PATIENT EDUCATION: Education details: Results of goals, D/C, continue with HEP  Person educated: Patient Education method: Explanation Education comprehension: Verbalized understanding  HEP: Access Code: [email or web address] URL: [email or web address] Date: 08/17/2021 Prepared by: Janann August  Exercises - Romberg Stance  Eyes Closed on Foam Pad  - 1 x daily - 5 x weekly - 3 sets - 30 hold - Standing Balance with Eyes Closed on Foam  - 1 x daily - 5 x weekly - 2 sets - 10 reps - Tandem Walking with Counter Support  - 1 x daily - 5 x weekly - 3 sets - Forward and Backward Walking with Eyes Closed and Counter Support  - 1 x daily - 5 x weekly - 3-4 sets   standing VOR x60 seconds with busy background on compliant surface.      GOALS: Goals reviewed with patient? Yes   SHORT TERM GOALS: Target date: 06/22/2021    Pt will undergo further assessment of FGA with LTG written as appropriate.  Baseline:18/30 Goal status: MET   2.  Pt will undergo further assessment of SOT with LTG written.  Baseline: Not yet assessed.  Goal status: MET    3.  Pt will hold condition 4 of mCTSIB for at least 25 seconds for improved vestibular input for balance.  Baseline: 17.84 seconds; 30 seconds on 06/28/21 Goal status: MET        Updated LTG date for 12 week POC with ongoing/updated LTGs:  LONG TERM GOALS: Target date: 08/30/21   Pt will be independent with final HEP in order to build upon functional gains made in therapy.   Baseline: pt compliant with HEP  Goal status: MET   2.  Pt will improve FGA to at least a 27/30 in order to demo decr fall risk.  Baseline: 18/30;  score 24/30 on 07-05-21; 28/30 on 08/17/21 Goal status: MET   3.  Pt will improve composite score to WNL and somatosensory/visual sensory analysis scores to [facility] in order to demo improved balance.  Baseline: Composite: 57 (below normal), somatosensory and visual sensory analysis below normal   On 08/10/21, Composite: 63 (below normal), somatosensory below normal, visual above normal Goal status: PARTIALLY MET    4.  Pt will hold condition 4 of mCTSIB for at least 20  seconds for improved vestibular input for balance.  Baseline: 17.84 seconds; 9.5 seconds on 07/12/21; 30 seconds on 08/09/21 Goal status: MET   5.  Pt will perform DVA with 2 line or less  difference in order to demo improved VOR.  Baseline: 4 line difference; 3 line difference on 07/12/21, 3 line difference on 08/17/21 Goal status: NOT MET    ASSESSMENT:   CLINICAL IMPRESSION: Today's skilled session focused on assessing remainder of LTGs. Pt partially met LTG #3, did not met LTG #5 and met LTGs #1, 2 and 4. Pt has made improvements in her balance, but continues with the spontaneous spinning episodes and has to take Meclizine 1-3 times a day. Pt's neurologist is planning to refer her to a specialist ENT/neurologist in [facility]. Pt improved FGA to a 28/30, at eval was an 18/30. Pt continues to have a 3 line difference during the DVA, indicating impaired VOR. Reviewed pt's HEP with pt verbalizing understanding and is compliant at home. Will continue to progress towards LTGs.    OBJECTIVE IMPAIRMENTS decreased balance, dizziness, and postural dysfunction.    ACTIVITY LIMITATIONS community activity.    PERSONAL FACTORS  Past/current experiences, Time since onset of injury/illness/exacerbation, and 3+ comorbidities: hypertension, gout, GERD, asthma, hyperlipidemia, type 2 diabetes, history of vertigo.    are also affecting patient's functional outcome.      REHAB POTENTIAL: Good   CLINICAL DECISION MAKING: Stable/uncomplicated   EVALUATION COMPLEXITY: Low     PLAN: PT FREQUENCY: 1x/week   PT DURATION: 12 weeks   PLANNED INTERVENTIONS: Therapeutic exercises, Therapeutic activity, Neuromuscular re-education, Balance training, Gait training, Patient/Family education, and Vestibular training   PLANNED INTERVENTIONS: Therapeutic exercises, Therapeutic activity, Neuromuscular re-education, Balance training, Gait training, Patient/Family education, Vestibular training, and Canalith repositioning      PLAN FOR NEXT SESSION:   D/C     Arliss Journey, PT, DPT  08/17/2021, 4:03 PM

## 2021-08-23 ENCOUNTER — Ambulatory Visit: Payer: Medicare Other | Admitting: Internal Medicine

## 2021-08-23 ENCOUNTER — Encounter: Payer: Self-pay | Admitting: Internal Medicine

## 2021-08-23 VITALS — Temp 98.0°F | Resp 16 | Ht 62.0 in | Wt 204.0 lb

## 2021-08-23 DIAGNOSIS — I10 Essential (primary) hypertension: Secondary | ICD-10-CM

## 2021-08-23 DIAGNOSIS — R42 Dizziness and giddiness: Secondary | ICD-10-CM

## 2021-08-23 DIAGNOSIS — R55 Syncope and collapse: Secondary | ICD-10-CM

## 2021-08-23 DIAGNOSIS — E119 Type 2 diabetes mellitus without complications: Secondary | ICD-10-CM

## 2021-08-23 NOTE — Patient Instructions (Signed)
Continue current medications Schedule echo and nuclear stress test Carotid duplex ordered Follow-up in 6 weeks

## 2021-08-23 NOTE — Progress Notes (Signed)
Primary Physician/Referring:  Gwenyth Bender, MD  Patient ID: Lauren Cantu, female    DOB: 09-11-1955, 66 y.o.   MRN: [id number]  Chief Complaint  Patient presents with   Coronary Artery Disease   Dizziness   New Patient (Initial Visit)   HPI:    Lauren Cantu  is a 66 y.o. F with past medical history significant for DM2, HTN and HLD who is here to establish care with cardiology. Lauren Cantu had some coronary calcifications seen on a CT scan but Lauren Cantu has been experiencing other symptoms as well. Lauren Cantu has gone to the ED multiple times for TIA like symptoms and severe vertigo. Patient has history of vertigo that was well controlled with meclizine but now states this is very severe and does not go away with meds. Lauren Cantu has seen ENT, neurology, and completed 12 weeks of physical therapy without any improvement in her symptoms. Lauren Cantu has not had a stress test or echo in the past. Lauren Cantu denies any cardiac history. No CP, palpitations, SOB, diaphoresis, PND, orthopnea, or edema.   Past Medical History:  Diagnosis Date   Arthritis    Essential hypertension, malignant    Extrinsic asthma, unspecified    GERD (gastroesophageal reflux disease)    Hyperlipidemia    Nonspecific elevation of levels of transaminase or lactic acid dehydrogenase (LDH)    Obesity, unspecified    Pain in joint, site unspecified    Type II or unspecified type diabetes mellitus without mention of complication, not stated as uncontrolled    Past Surgical History:  Procedure Laterality Date   ABDOMINAL HYSTERECTOMY     KNEE SURGERY Right    TOE SURGERY Bilateral    Family History  Problem Relation Age of Onset   Diabetes Mother    Heart disease Mother    Pancreatic cancer Father    Hypertension Sister    Hypertension Sister    Angelman syndrome Neg Hx     Social History   Tobacco Use   Smoking status: Former    Packs/day: 1.00    Years: 50.00    Total pack years: 50.00    Types: Cigarettes    Quit date: 03/24/2021     Years since quitting: 0.4   Smokeless tobacco: Never  Substance Use Topics   Alcohol use: No   Marital Status: Single  ROS  Review of Systems  Cardiovascular:  Positive for near-syncope and syncope.  Neurological:  Positive for dizziness, focal weakness, headaches, light-headedness and vertigo.  All other systems reviewed and are negative.  Objective  Temperature 98 F (36.7 C), resp. rate 16, height 5\' 2"  (1.575 m), weight 204 lb (92.5 kg), SpO2 94 %. Body mass index is 37.31 kg/m.     08/23/2021    1:10 PM 07/24/2021    7:34 PM 07/24/2021    7:00 PM  Vitals with BMI  Height 5\' 2"     Weight 204 lbs    BMI 37.3    Systolic  122 129  Diastolic  68 73  Pulse  87 88     Physical Exam Vitals reviewed.  Constitutional:      General: Lauren Cantu is not in acute distress.    Appearance: Normal appearance.  HENT:     Head: Normocephalic and atraumatic.  Eyes:     Extraocular Movements: Extraocular movements intact.  Neck:     Vascular: No carotid bruit.  Cardiovascular:     Rate and Rhythm: Normal rate and regular rhythm.  Pulses: Normal pulses.     Heart sounds: Normal heart sounds. No murmur heard.    No friction rub. No gallop.  Pulmonary:     Effort: Pulmonary effort is normal.     Breath sounds: Normal breath sounds.  Abdominal:     General: Abdomen is flat. Bowel sounds are normal.     Palpations: Abdomen is soft.  Skin:    General: Skin is warm and dry.  Neurological:     General: No focal deficit present.     Mental Status: Lauren Cantu is alert.     Medications and allergies   Allergies  Allergen Reactions   Colchicine Itching   Lemon Eucalyptus (Citriodora) Oil    Orange Fruit [Citrus]    Pineapple    Sulfa Antibiotics Itching   Celebrex [Celecoxib] Rash   Zofran [Ondansetron] Hives     Medication list after today's encounter   Current Outpatient Medications:    allopurinol (ZYLOPRIM) 300 MG tablet, Take 300 mg by mouth daily.  , Disp: , Rfl:    alum & mag  hydroxide-simeth (MAALOX/MYLANTA) 200-200-20 MG/5ML suspension, Take 30 mLs by mouth as needed for indigestion or heartburn., Disp: , Rfl:    amLODipine (NORVASC) 10 MG tablet, Take 10 mg by mouth daily., Disp: , Rfl:    benazepril (LOTENSIN) 40 MG tablet, Take 40 mg by mouth daily., Disp: , Rfl:    Calcium Carbonate-Vitamin D (CALTRATE 600+D PO), Take 2 tablets by mouth daily.  , Disp: , Rfl:    CALCIUM-MAGNESIUM-ZINC PO, Take 1 tablet by mouth daily., Disp: , Rfl:    Fluocinolone Acetonide 0.01 % OIL, Place 6 drops into both ears in the morning and at bedtime., Disp: , Rfl:    gabapentin (NEURONTIN) 100 MG capsule, Take 100 mg by mouth daily., Disp: , Rfl:    lovastatin (MEVACOR) 20 MG tablet, Take 20 mg by mouth daily., Disp: , Rfl:    meclizine (ANTIVERT) 12.5 MG tablet, Take 1 tablet (12.5 mg total) by mouth 3 (three) times daily as needed for dizziness., Disp: 30 tablet, Rfl: 0   meloxicam (MOBIC) 7.5 MG tablet, Take 7.5 mg by mouth daily., Disp: , Rfl:    montelukast (SINGULAIR) 10 MG tablet, Take 10 mg by mouth daily.  , Disp: , Rfl:    Multiple Vitamins-Minerals (CENTRUM SILVER PO), Take 1 tablet by mouth daily., Disp: , Rfl:    omeprazole (PRILOSEC) 40 MG capsule, Take 1 capsule (40 mg total) by mouth 2 (two) times daily. (Patient taking differently: Take 40 mg by mouth daily.), Disp: 90 capsule, Rfl: 3   potassium chloride (KLOR-CON) 10 MEQ tablet, Take 10 mEq by mouth 2 (two) times daily., Disp: , Rfl:    VENTOLIN HFA 108 (90 Base) MCG/ACT inhaler, Inhale 1 puff into the lungs every 4 (four) hours as needed for wheezing or shortness of breath., Disp: , Rfl:   Laboratory examination:   Lab Results  Component Value Date   NA 142 07/24/2021   K 4.2 07/24/2021   CO2 24 07/24/2021   GLUCOSE 108 (H) 07/24/2021   BUN 13 07/24/2021   CREATININE 0.89 07/24/2021   CALCIUM 9.9 07/24/2021   GFRNONAA >60 07/24/2021       Latest Ref Rng & Units 07/24/2021    5:15 PM 05/01/2021    3:25 PM  04/28/2021    5:26 AM  CMP  Glucose 70 - 99 mg/dL 175  102  585   BUN 8 - 23 mg/dL 13  11  18   Creatinine 0.44 - 1.00 mg/dL 9.37  9.02  4.09   Sodium 135 - 145 mmol/L 142  138  142   Potassium 3.5 - 5.1 mmol/L 4.2  4.0  4.6   Chloride 98 - 111 mmol/L 109  107  110   CO2 22 - 32 mmol/L 24  20    Calcium 8.9 - 10.3 mg/dL 9.9  9.9    Total Protein 6.5 - 8.1 g/dL 7.6  7.4    Total Bilirubin 0.3 - 1.2 mg/dL 0.6  0.7    Alkaline Phos 38 - 126 U/L 89  102    AST 15 - 41 U/L 36  37    ALT 0 - 44 U/L 25  24        Latest Ref Rng & Units 07/24/2021    5:15 PM 05/01/2021    3:25 PM 04/28/2021    5:26 AM  CBC  WBC 4.0 - 10.5 K/uL 6.9  7.3    Hemoglobin 12.0 - 15.0 g/dL 73.5  32.9  92.4   Hematocrit 36.0 - 46.0 % 39.1  41.4  44.0   Platelets 150 - 400 K/uL 305  280      Lipid Panel No results for input(s): "CHOL", "TRIG", "LDLCALC", "VLDL", "HDL", "CHOLHDL", "LDLDIRECT" in the last 8760 hours.  HEMOGLOBIN A1C Lab Results  Component Value Date   HGBA1C 5.9 (H) 05/02/2021   MPG 122.63 05/02/2021   TSH Recent Labs    07/24/21 1749  TSH 0.977    External labs:     Radiology:    Cardiac Studies:   No results found for this or any previous visit from the past 1095 days.     No results found for this or any previous visit from the past 1095 days.     EKG:   8/82023 EKG: NSR, low voltage - otherwise normal EKG    Assessment     ICD-10-CM   1. Dizziness  R42 EKG 12-Lead    PCV ECHOCARDIOGRAM COMPLETE    PCV CAROTID DUPLEX (BILATERAL)    PCV MYOCARDIAL PERFUSION WO LEXISCAN    2. Essential hypertension  I10 PCV ECHOCARDIOGRAM COMPLETE    PCV CAROTID DUPLEX (BILATERAL)    PCV MYOCARDIAL PERFUSION WO LEXISCAN    3. Syncope and collapse  R55 PCV ECHOCARDIOGRAM COMPLETE    PCV CAROTID DUPLEX (BILATERAL)    PCV MYOCARDIAL PERFUSION WO LEXISCAN    4. Vertigo  R42        Orders Placed This Encounter  Procedures   PCV MYOCARDIAL PERFUSION WO LEXISCAN    Standing  Status:   Future    Standing Expiration Date:   10/23/2021   EKG 12-Lead   PCV ECHOCARDIOGRAM COMPLETE    Standing Status:   Future    Standing Expiration Date:   08/24/2022    No orders of the defined types were placed in this encounter.   Medications Discontinued During This Encounter  Medication Reason   acetaminophen (TYLENOL) 500 MG tablet    predniSONE (DELTASONE) 20 MG tablet      Recommendations:   Lauren Cantu is a 66 y.o.  F with history of TIA and syncope  Continue current medications Schedule echo and nuclear stress test Carotid duplex ordered Follow-up in 6 weeks    Clotilde Dieter, DO  08/23/2021, 3:02 PM Office: [phone] Pager: [phone]

## 2021-09-14 ENCOUNTER — Other Ambulatory Visit: Payer: Medicare Other

## 2021-10-05 ENCOUNTER — Ambulatory Visit: Payer: Medicare Other

## 2021-10-05 DIAGNOSIS — I10 Essential (primary) hypertension: Secondary | ICD-10-CM

## 2021-10-05 DIAGNOSIS — R42 Dizziness and giddiness: Secondary | ICD-10-CM

## 2021-10-05 DIAGNOSIS — R55 Syncope and collapse: Secondary | ICD-10-CM

## 2021-10-13 ENCOUNTER — Ambulatory Visit: Payer: Medicare Other | Admitting: Internal Medicine

## 2021-10-13 ENCOUNTER — Encounter: Payer: Self-pay | Admitting: Internal Medicine

## 2021-10-13 VITALS — BP 130/64 | HR 89 | Temp 97.7°F | Resp 16 | Ht 62.0 in | Wt 196.2 lb

## 2021-10-13 DIAGNOSIS — R55 Syncope and collapse: Secondary | ICD-10-CM

## 2021-10-13 DIAGNOSIS — E119 Type 2 diabetes mellitus without complications: Secondary | ICD-10-CM

## 2021-10-13 DIAGNOSIS — R002 Palpitations: Secondary | ICD-10-CM | POA: Insufficient documentation

## 2021-10-13 MED ORDER — DILTIAZEM HCL 30 MG PO TABS: 30.0000 mg | ORAL_TABLET | Freq: Three times a day (TID) | ORAL | 6 refills | Status: AC | PRN

## 2021-10-13 NOTE — Progress Notes (Signed)
Primary Physician/Referring:  Gwenyth Bender, MD  Patient ID: Lauren Cantu, female    DOB: Nov 17, 1955, 66 y.o.   MRN: [id number]  Chief Complaint  Patient presents with   Follow-up   HPI:    Lauren Cantu  is a 66 y.o. F with past medical history significant for DM2, HTN and HLD who is here for a follow-up visit. She is still having vertigo, palpitations, and near-syncope. Patient is willing to try low dose Cardizem PRN for this. Referrals to nephrology and endocrinology provided. No CP, claudication, SOB, diaphoresis, PND, orthopnea, or edema.   Past Medical History:  Diagnosis Date   Arthritis    Essential hypertension, malignant    Extrinsic asthma, unspecified    GERD (gastroesophageal reflux disease)    Hyperlipidemia    Nonspecific elevation of levels of transaminase or lactic acid dehydrogenase (LDH)    Obesity, unspecified    Pain in joint, site unspecified    Type II or unspecified type diabetes mellitus without mention of complication, not stated as uncontrolled    Past Surgical History:  Procedure Laterality Date   ABDOMINAL HYSTERECTOMY     KNEE SURGERY Right    TOE SURGERY Bilateral    Family History  Problem Relation Age of Onset   Diabetes Mother    Heart disease Mother    Pancreatic cancer Father    Hypertension Sister    Hypertension Sister    Angelman syndrome Neg Hx     Social History   Tobacco Use   Smoking status: Former    Packs/day: 1.00    Years: 50.00    Total pack years: 50.00    Types: Cigarettes    Quit date: 03/24/2021    Years since quitting: 0.5   Smokeless tobacco: Never  Substance Use Topics   Alcohol use: No   Marital Status: Single  ROS  Review of Systems  Cardiovascular:  Positive for near-syncope and palpitations.  Neurological:  Positive for vertigo.  All other systems reviewed and are negative.  Objective  Blood pressure 130/64, pulse 89, temperature 97.7 F (36.5 C), temperature source Temporal, resp. rate 16,  height 5\' 2"  (1.575 m), weight 196 lb 3.2 oz (89 kg), SpO2 94 %. Body mass index is 35.89 kg/m.     10/13/2021    1:53 PM 08/23/2021    1:10 PM 07/24/2021    7:34 PM  Vitals with BMI  Height 5\' 2"  5\' 2"    Weight 196 lbs 3 oz 204 lbs   BMI 35.88 37.3   Systolic 130  122  Diastolic 64  68  Pulse 89  87     Physical Exam Vitals reviewed.  Constitutional:      General: She is not in acute distress.    Appearance: Normal appearance.  HENT:     Head: Normocephalic and atraumatic.  Eyes:     Extraocular Movements: Extraocular movements intact.  Neck:     Vascular: No carotid bruit.  Cardiovascular:     Rate and Rhythm: Normal rate and regular rhythm.     Pulses: Normal pulses.     Heart sounds: Normal heart sounds. No murmur heard.    No friction rub. No gallop.  Pulmonary:     Effort: Pulmonary effort is normal.     Breath sounds: Normal breath sounds.  Abdominal:     General: Abdomen is flat. Bowel sounds are normal.     Palpations: Abdomen is soft.  Skin:    General:  Skin is warm and dry.  Neurological:     General: No focal deficit present.     Mental Status: She is alert.    Medications and allergies   Allergies  Allergen Reactions   Colchicine Itching   Lemon Eucalyptus (Citriodora) Oil    Orange Fruit [Citrus]    Pineapple    Sulfa Antibiotics Itching   Celebrex [Celecoxib] Rash   Zofran [Ondansetron] Hives     Medication list after today's encounter   Current Outpatient Medications:    allopurinol (ZYLOPRIM) 300 MG tablet, Take 300 mg by mouth daily.  , Disp: , Rfl:    amLODipine (NORVASC) 10 MG tablet, Take 10 mg by mouth daily., Disp: , Rfl:    benazepril (LOTENSIN) 40 MG tablet, Take 40 mg by mouth daily., Disp: , Rfl:    Calcium Carbonate-Vitamin D (CALTRATE 600+D PO), Take 2 tablets by mouth daily.  , Disp: , Rfl:    CALCIUM-MAGNESIUM-ZINC PO, Take 1 tablet by mouth daily., Disp: , Rfl:    Fluocinolone Acetonide 0.01 % OIL, Place 6 drops into both  ears in the morning and at bedtime., Disp: , Rfl:    gabapentin (NEURONTIN) 100 MG capsule, Take 100 mg by mouth daily., Disp: , Rfl:    lovastatin (MEVACOR) 20 MG tablet, Take 20 mg by mouth daily., Disp: , Rfl:    meclizine (ANTIVERT) 12.5 MG tablet, Take 1 tablet (12.5 mg total) by mouth 3 (three) times daily as needed for dizziness., Disp: 30 tablet, Rfl: 0   meloxicam (MOBIC) 7.5 MG tablet, Take 7.5 mg by mouth daily., Disp: , Rfl:    montelukast (SINGULAIR) 10 MG tablet, Take 10 mg by mouth daily.  , Disp: , Rfl:    Multiple Vitamins-Minerals (CENTRUM SILVER PO), Take 1 tablet by mouth daily., Disp: , Rfl:    omeprazole (PRILOSEC) 40 MG capsule, Take 1 capsule (40 mg total) by mouth 2 (two) times daily. (Patient taking differently: Take 40 mg by mouth daily.), Disp: 90 capsule, Rfl: 3   potassium chloride (KLOR-CON) 10 MEQ tablet, Take 10 mEq by mouth 2 (two) times daily., Disp: , Rfl:    VENTOLIN HFA 108 (90 Base) MCG/ACT inhaler, Inhale 1 puff into the lungs every 4 (four) hours as needed for wheezing or shortness of breath., Disp: , Rfl:    alum & mag hydroxide-simeth (MAALOX/MYLANTA) 200-200-20 MG/5ML suspension, Take 30 mLs by mouth as needed for indigestion or heartburn., Disp: , Rfl:   Laboratory examination:   Lab Results  Component Value Date   NA 142 07/24/2021   K 4.2 07/24/2021   CO2 24 07/24/2021   GLUCOSE 108 (H) 07/24/2021   BUN 13 07/24/2021   CREATININE 0.89 07/24/2021   CALCIUM 9.9 07/24/2021   GFRNONAA >60 07/24/2021       Latest Ref Rng & Units 07/24/2021    5:15 PM 05/01/2021    3:25 PM 04/28/2021    5:26 AM  CMP  Glucose 70 - 99 mg/dL 108  100  108   BUN 8 - 23 mg/dL 13  11  18    Creatinine 0.44 - 1.00 mg/dL 0.89  0.92  1.00   Sodium 135 - 145 mmol/L 142  138  142   Potassium 3.5 - 5.1 mmol/L 4.2  4.0  4.6   Chloride 98 - 111 mmol/L 109  107  110   CO2 22 - 32 mmol/L 24  20    Calcium 8.9 - 10.3 mg/dL 9.9  9.9  Total Protein 6.5 - 8.1 g/dL 7.6  7.4     Total Bilirubin 0.3 - 1.2 mg/dL 0.6  0.7    Alkaline Phos 38 - 126 U/L 89  102    AST 15 - 41 U/L 36  37    ALT 0 - 44 U/L 25  24        Latest Ref Rng & Units 07/24/2021    5:15 PM 05/01/2021    3:25 PM 04/28/2021    5:26 AM  CBC  WBC 4.0 - 10.5 K/uL 6.9  7.3    Hemoglobin 12.0 - 15.0 g/dL 09.4  70.9  62.8   Hematocrit 36.0 - 46.0 % 39.1  41.4  44.0   Platelets 150 - 400 K/uL 305  280      Lipid Panel No results for input(s): "CHOL", "TRIG", "LDLCALC", "VLDL", "HDL", "CHOLHDL", "LDLDIRECT" in the last 8760 hours.  HEMOGLOBIN A1C Lab Results  Component Value Date   HGBA1C 5.9 (H) 05/02/2021   MPG 122.63 05/02/2021   TSH Recent Labs    07/24/21 1749  TSH 0.977     External labs:     Radiology:    Cardiac Studies:   Carotid artery duplex October 12, 2021:  The bifurcation, internal, external and common carotid arteries reveal no  evidence of significant stenosis, bilaterally.  No significant plaque burden noted.  Antegrade right vertebral artery flow. Antegrade left vertebral artery  flow.    Echocardiogram 10/04/2021:  Normal LV systolic function with visual EF 55-60%. Left ventricle cavity  is normal in size. Moderate concentric hypertrophy of the left ventricle.  Normal global wall motion. Normal diastolic filling pattern, normal LAP.  Calculated EF 59%.  Left atrial cavity is mildly dilated at 3.9 cm.  Structurally normal tricuspid valve with trace regurgitation. No evidence  of pulmonary hypertension.  no prior available for comparison.   Lexiscan Tetrofosmin stress test 2021-10-12: Lexiscan nuclear stress test performed using 1-day protocol. Decreased tracer uptake in inferior myocardium more prominent in stress images- likely due to breast attenuation in the setting of imaging performed in sitting position. No definite evidence of ischemia.  normal wall motion and myocardial thickening. Stress LVEF 78%. Low risk study.     EKG:   8/82023 EKG: NSR,  low voltage - otherwise normal EKG    Assessment   No diagnosis found.    No orders of the defined types were placed in this encounter.   No orders of the defined types were placed in this encounter.   There are no discontinued medications.    Recommendations:   Lauren Cantu is a 66 y.o.  F with history of TIA and syncope  Palpitations Cardizem 30mg  TID PRN sent to pharmacy  Type 2 diabetes mellitus without complication, without long-term current use of insulin [facility]) Referral to endocrinology provided  Syncope and collapse Referral to nephrology provided Carotid duplex, echo, stress test all within normal limits   Follow-up in 6 months or sooner if needed    [facility], DO  10/13/2021, 2:03 PM Office: [phone] Pager: [phone]

## 2021-10-14 ENCOUNTER — Other Ambulatory Visit: Payer: Self-pay | Admitting: Internal Medicine

## 2022-04-03 ENCOUNTER — Ambulatory Visit: Payer: Medicare Other | Admitting: Neurology

## 2022-04-12 ENCOUNTER — Ambulatory Visit: Payer: 59 | Admitting: Internal Medicine

## 2022-04-12 ENCOUNTER — Encounter: Payer: Self-pay | Admitting: Internal Medicine

## 2022-04-12 VITALS — BP 118/71 | HR 93 | Ht 62.0 in | Wt 195.2 lb

## 2022-04-12 DIAGNOSIS — E782 Mixed hyperlipidemia: Secondary | ICD-10-CM

## 2022-04-12 DIAGNOSIS — R42 Dizziness and giddiness: Secondary | ICD-10-CM

## 2022-04-12 DIAGNOSIS — R002 Palpitations: Secondary | ICD-10-CM

## 2022-04-12 DIAGNOSIS — I10 Essential (primary) hypertension: Secondary | ICD-10-CM

## 2022-04-12 NOTE — Progress Notes (Signed)
Primary Physician/Referring:  Rogers Blocker, MD  Patient ID: Lauren Cantu, female    DOB: May 02, 1955, 67 y.o.   MRN: [id number]  Chief Complaint  Patient presents with   Palpitations   Follow-up   Shortness of Breath   HPI:    Lauren Cantu  is a 67 y.o. F with past medical history significant for diabetes, hypertension, and hyperlipidemia who is here for a follow-up visit. Her palpitations are well controlled and she is not having issues with that any more. She is still having vertigo and has been extensively evaluated for this. She saw two different ENT doctors and four neurologists but has not found a resolve. She does state that meclizine is the only thing that helps control it and it is still working for her. From a cardiac standpoint, she has been doing well. Patient denies chest pain, shortness of breath, palpitations, diaphoresis, syncope, edema, PND, orthopnea.   Past Medical History:  Diagnosis Date   Arthritis    Essential hypertension, malignant    Extrinsic asthma, unspecified    GERD (gastroesophageal reflux disease)    Hyperlipidemia    Nonspecific elevation of levels of transaminase or lactic acid dehydrogenase (LDH)    Obesity, unspecified    Pain in joint, site unspecified    Type II or unspecified type diabetes mellitus without mention of complication, not stated as uncontrolled    Past Surgical History:  Procedure Laterality Date   ABDOMINAL HYSTERECTOMY     KNEE SURGERY Right    TOE SURGERY Bilateral    Family History  Problem Relation Age of Onset   Diabetes Mother    Heart disease Mother    Pancreatic cancer Father    Hypertension Sister    Hypertension Sister    Angelman syndrome Neg Hx     Social History   Tobacco Use   Smoking status: Former    Packs/day: 1.00    Years: 50.00    Additional pack years: 0.00    Total pack years: 50.00    Types: Cigarettes    Quit date: 03/24/2021    Years since quitting: 1.0   Smokeless tobacco: Never   Substance Use Topics   Alcohol use: No   Marital Status: Single  ROS  Review of Systems  Cardiovascular:  Negative for near-syncope and palpitations.  Neurological:  Positive for vertigo.  All other systems reviewed and are negative.  Objective  Blood pressure 118/71, pulse 93, height 5\' 2"  (1.575 m), weight 195 lb 3.2 oz (88.5 kg). Body mass index is 35.7 kg/m.     04/12/2022    2:13 PM 10/13/2021    1:53 PM 08/23/2021    1:10 PM  Vitals with BMI  Height 5\' 2"  5\' 2"  5\' 2"   Weight 195 lbs 3 oz 196 lbs 3 oz 204 lbs  BMI 35.69 [id number]  Systolic [id number] [id number]   Diastolic 71 64   Pulse 93 89      Physical Exam Vitals reviewed.  Constitutional:      General: She is not in acute distress.    Appearance: Normal appearance.  HENT:     Head: Normocephalic and atraumatic.  Eyes:     Extraocular Movements: Extraocular movements intact.  Neck:     Vascular: No carotid bruit.  Cardiovascular:     Rate and Rhythm: Normal rate and regular rhythm.     Pulses: Normal pulses.     Heart sounds: Normal heart sounds. No murmur heard.  No friction rub. No gallop.  Pulmonary:     Effort: Pulmonary effort is normal.     Breath sounds: Normal breath sounds.  Abdominal:     General: Abdomen is flat. Bowel sounds are normal.     Palpations: Abdomen is soft.  Skin:    General: Skin is warm and dry.  Neurological:     General: No focal deficit present.     Mental Status: She is alert.     Medications and allergies   Allergies  Allergen Reactions   Colchicine Itching   Lemon Eucalyptus (Citriodora) Oil    Orange Fruit [Citrus]    Pineapple    Sulfa Antibiotics Itching   Celebrex [Celecoxib] Rash   Zofran [Ondansetron] Hives     Medication list after today's encounter   Current Outpatient Medications:    allopurinol (ZYLOPRIM) 300 MG tablet, Take 300 mg by mouth daily.  , Disp: , Rfl:    alum & mag hydroxide-simeth (MAALOX/MYLANTA) 200-200-20 MG/5ML suspension, Take 30 mLs by  mouth as needed for indigestion or heartburn., Disp: , Rfl:    amLODipine (NORVASC) 10 MG tablet, Take 10 mg by mouth daily., Disp: , Rfl:    benazepril (LOTENSIN) 40 MG tablet, Take 40 mg by mouth daily., Disp: , Rfl:    Calcium Carbonate-Vitamin D (CALTRATE 600+D PO), Take 2 tablets by mouth daily.  , Disp: , Rfl:    CALCIUM-MAGNESIUM-ZINC PO, Take 1 tablet by mouth daily., Disp: , Rfl:    Fluocinolone Acetonide 0.01 % OIL, Place 6 drops into both ears in the morning and at bedtime., Disp: , Rfl:    Fluticasone-Umeclidin-Vilant (TRELEGY ELLIPTA) 100-62.5-25 MCG/ACT AEPB, Inhale into the lungs., Disp: , Rfl:    gabapentin (NEURONTIN) 100 MG capsule, Take 100 mg by mouth daily., Disp: , Rfl:    lovastatin (MEVACOR) 20 MG tablet, Take 20 mg by mouth daily., Disp: , Rfl:    meclizine (ANTIVERT) 12.5 MG tablet, Take 1 tablet (12.5 mg total) by mouth 3 (three) times daily as needed for dizziness., Disp: 30 tablet, Rfl: 0   meloxicam (MOBIC) 7.5 MG tablet, Take 7.5 mg by mouth daily., Disp: , Rfl:    montelukast (SINGULAIR) 10 MG tablet, Take 10 mg by mouth daily.  , Disp: , Rfl:    Multiple Vitamins-Minerals (CENTRUM SILVER PO), Take 1 tablet by mouth daily., Disp: , Rfl:    omeprazole (PRILOSEC) 40 MG capsule, Take 1 capsule (40 mg total) by mouth 2 (two) times daily. (Patient taking differently: Take 40 mg by mouth daily.), Disp: 90 capsule, Rfl: 3   potassium chloride (KLOR-CON) 10 MEQ tablet, Take 10 mEq by mouth 2 (two) times daily., Disp: , Rfl:    VENTOLIN HFA 108 (90 Base) MCG/ACT inhaler, Inhale 1 puff into the lungs every 4 (four) hours as needed for wheezing or shortness of breath., Disp: , Rfl:    diltiazem (CARDIZEM) 30 MG tablet, Take 1 tablet (30 mg total) by mouth 3 (three) times daily as needed. (Patient not taking: Reported on 04/12/2022), Disp: 90 tablet, Rfl: 6  Laboratory examination:   Lab Results  Component Value Date   NA 142 07/24/2021   K 4.2 07/24/2021   CO2 24  07/24/2021   GLUCOSE 108 (H) 07/24/2021   BUN 13 07/24/2021   CREATININE 0.89 07/24/2021   CALCIUM 9.9 07/24/2021   GFRNONAA >60 07/24/2021       Latest Ref Rng & Units 07/24/2021    5:15 PM 05/01/2021  3:25 PM 04/28/2021    5:26 AM  CMP  Glucose 70 - 99 mg/dL 108  100  108   BUN 8 - 23 mg/dL 13  11  18    Creatinine 0.44 - 1.00 mg/dL 0.89  0.92  1.00   Sodium 135 - 145 mmol/L 142  138  142   Potassium 3.5 - 5.1 mmol/L 4.2  4.0  4.6   Chloride 98 - 111 mmol/L 109  107  110   CO2 22 - 32 mmol/L 24  20    Calcium 8.9 - 10.3 mg/dL 9.9  9.9    Total Protein 6.5 - 8.1 g/dL 7.6  7.4    Total Bilirubin 0.3 - 1.2 mg/dL 0.6  0.7    Alkaline Phos 38 - 126 U/L 89  102    AST 15 - 41 U/L 36  37    ALT 0 - 44 U/L 25  24        Latest Ref Rng & Units 07/24/2021    5:15 PM 05/01/2021    3:25 PM 04/28/2021    5:26 AM  CBC  WBC 4.0 - 10.5 K/uL 6.9  7.3    Hemoglobin 12.0 - 15.0 g/dL 13.4  14.4  15.0   Hematocrit 36.0 - 46.0 % 39.1  41.4  44.0   Platelets 150 - 400 K/uL 305  280      Lipid Panel No results for input(s): "CHOL", "TRIG", "LDLCALC", "VLDL", "HDL", "CHOLHDL", "LDLDIRECT" in the last 8760 hours.  HEMOGLOBIN A1C Lab Results  Component Value Date   HGBA1C 5.9 (H) 05/02/2021   MPG 122.63 05/02/2021   TSH Recent Labs    07/24/21 1749  TSH 0.977    External labs:     Radiology:    Cardiac Studies:   Carotid artery duplex Oct 30, 2021:  The bifurcation, internal, external and common carotid arteries reveal no  evidence of significant stenosis, bilaterally.  No significant plaque burden noted.  Antegrade right vertebral artery flow. Antegrade left vertebral artery  flow.    Echocardiogram 10/04/2021:  Normal LV systolic function with visual EF 55-60%. Left ventricle cavity  is normal in size. Moderate concentric hypertrophy of the left ventricle.  Normal global wall motion. Normal diastolic filling pattern, normal LAP.  Calculated EF 59%.  Left atrial cavity is  mildly dilated at 3.9 cm.  Structurally normal tricuspid valve with trace regurgitation. No evidence  of pulmonary hypertension.  no prior available for comparison.   Lexiscan Tetrofosmin stress test 10-30-21: Lexiscan nuclear stress test performed using 1-day protocol. Decreased tracer uptake in inferior myocardium more prominent in stress images- likely due to breast attenuation in the setting of imaging performed in sitting position. No definite evidence of ischemia.  normal wall motion and myocardial thickening. Stress LVEF 78%. Low risk study.   EKG:   8/82023 EKG: NSR, low voltage - otherwise normal EKG    04/12/2022: Sinus Rhythm with PVCs, rate 89 bpm. Nonspecific T-abnormality.  Assessment     ICD-10-CM   1. Palpitations  R00.2 EKG 12-Lead    2. Essential hypertension  I10     3. Mixed hyperlipidemia  E78.2     4. Vertigo  R42         Orders Placed This Encounter  Procedures   EKG 12-Lead     No orders of the defined types were placed in this encounter.   There are no discontinued medications.    Recommendations:   DEJUANA SPEIR is a 67  y.o.  F with HTN, HLD, and diabetes  Palpitations Controlled on Cardizem 30mg  TID PRN   Essential hypertension Continue current cardiac medications. Encourage low-sodium diet, less than 2000 mg daily.  Vertigo Patient has been evaluated extensively by 2 ENT doctors and 4 neurologists Her vertigo is still present and symptomatic but meclizine works to control it Carotid duplex, echo, stress test all within normal limits  Mixed hyperlipidemia Continue on lovastatin PCP following lipids Follow-up in 6 months or sooner if needed    Lauren Flock, DO  04/12/2022, 2:55 PM Office: [phone] Pager: [phone]

## 2022-04-13 ENCOUNTER — Ambulatory Visit: Payer: 59 | Admitting: Internal Medicine

## 2022-10-13 ENCOUNTER — Ambulatory Visit: Payer: 59 | Admitting: Internal Medicine

## 2022-10-20 ENCOUNTER — Ambulatory Visit: Payer: 59 | Admitting: Cardiology

## 2022-11-03 ENCOUNTER — Encounter: Payer: Self-pay | Admitting: Cardiology

## 2022-11-03 ENCOUNTER — Ambulatory Visit: Payer: 59 | Attending: Internal Medicine | Admitting: Cardiology

## 2022-11-03 VITALS — BP 104/62 | HR 84 | Resp 16 | Ht 62.0 in | Wt 207.0 lb

## 2022-11-03 DIAGNOSIS — E782 Mixed hyperlipidemia: Secondary | ICD-10-CM | POA: Diagnosis not present

## 2022-11-03 DIAGNOSIS — I10 Essential (primary) hypertension: Secondary | ICD-10-CM

## 2022-11-03 DIAGNOSIS — Z79899 Other long term (current) drug therapy: Secondary | ICD-10-CM | POA: Diagnosis not present

## 2022-11-03 NOTE — Patient Instructions (Signed)
Medication Instructions:   Your physician recommends that you continue on your current medications as directed. Please refer to the Current Medication list given to you today.  *If you need a refill on your cardiac medications before your next appointment, please call your pharmacy*   Lab Work:  TODAY--LIPID PANEL AND PRO-BNP  If you have labs (blood work) drawn today and your tests are completely normal, you will receive your results only by: MyChart Message (if you have MyChart) OR A paper copy in the mail If you have any lab test that is abnormal or we need to change your treatment, we will call you to review the results.    Follow-Up:  AS NEEDED WITH DR. PATWARDHAN

## 2022-11-03 NOTE — Progress Notes (Signed)
Cardiology Office Note:  .   Date:  11/03/2022  ID:  Lauren Cantu, DOB 1955/01/19, MRN [id number] PCP: Gwenyth Bender, MD  [facility] Providers Cardiologist:  Truett Mainland, MD PCP: Gwenyth Bender, MD  Chief Complaint  Patient presents with   Palpitations        Follow-up           History of Present Illness: .    Lauren Cantu is a 67 y.o. female with hypertension, hyperlipidemia, type 2 DM  Although she has mild exertional dyspnea, patient's physical activity is limited due to back pain.  Blood pressure is well-controlled.  Medical remains.  Palpitations are infrequent and short lasting.  Vitals:   11/03/22 1530  BP: 104/62  Pulse: 84  Resp: 16  SpO2: 97%     ROS:  Review of Systems  Cardiovascular:  Positive for dyspnea on exertion. Negative for chest pain, leg swelling, palpitations and syncope.     Studies Reviewed: Marland Kitchen        Lexiscan Tetrofosmin stress test 10/05/2021: Lexiscan nuclear stress test performed using 1-day protocol. Decreased tracer uptake in inferior myocardium more prominent in stress images- likely due to breast attenuation in the setting of imaging performed in sitting position. No definite evidence of ischemia.  normal wall motion and myocardial thickening. Stress LVEF 78%. Low risk study.  Carotid artery duplex 10/05/2021: The bifurcation, internal, external and common carotid arteries reveal no evidence of significant stenosis, bilaterally. No significant plaque burden noted. Antegrade right vertebral artery flow. Antegrade left vertebral artery flow.    Echocardiogram 10/04/2021:  Normal LV systolic function with visual EF 55-60%. Left ventricle cavity  is normal in size. Moderate concentric hypertrophy of the left ventricle.  Normal global wall motion. Normal diastolic filling pattern, normal LAP.  Calculated EF 59%.  Left atrial cavity is mildly dilated at 3.9 cm.  Structurally normal tricuspid valve with trace  regurgitation. No evidence  of pulmonary hypertension.  no prior available for comparison.    Physical Exam:   Physical Exam Vitals and nursing note reviewed.  Constitutional:      General: She is not in acute distress. Neck:     Vascular: No JVD.  Cardiovascular:     Rate and Rhythm: Normal rate and regular rhythm.     Heart sounds: Normal heart sounds. No murmur heard. Pulmonary:     Effort: Pulmonary effort is normal.     Breath sounds: Normal breath sounds. No wheezing or rales.  Musculoskeletal:     Right lower leg: No edema.     Left lower leg: No edema.      VISIT DIAGNOSES:   ICD-10-CM   1. Mixed hyperlipidemia  E78.2 Lipid Profile    Pro b natriuretic peptide    2. Essential hypertension  I10 Lipid Profile    Pro b natriuretic peptide    3. Medication management  Z79.899 Lipid Profile    Pro b natriuretic peptide    4. Type 2 diabetes mellitus without complication, without long-term current use of insulin (HCC)  E11.9 Lipid Profile    Pro b natriuretic peptide       ASSESSMENT AND PLAN: .    Lauren Cantu is a 67 y.o. female with hypertension, hyperlipidemia, h/o diabetes (not currently on medications), vertigo  Exertional dyspnea: Mild, clinically evaluate.  Check proBNP for screening.  Previously, noted to have moderate LVH, but normal EF and no significant diastolic dysfunction.  Palpitations: Infrequent.  I  do not think she will benefit from monitor evaluation at this time.  Hyperlipidemia: Check lipid panel.   Unless any significant abnormalities found on above testing, we will see her as needed.  Signed, Elder Negus, MD

## 2022-11-04 LAB — PRO B NATRIURETIC PEPTIDE: NT-Pro BNP: 94 pg/mL (ref 0–301)

## 2022-11-04 LAB — LIPID PANEL
Chol/HDL Ratio: 3.1 {ratio} (ref 0.0–4.4)
Cholesterol, Total: 185 mg/dL (ref 100–199)
HDL: 60 mg/dL (ref >39)
LDL Chol Calc (NIH): 99 mg/dL (ref 0–99)
Triglycerides: 152 mg/dL — ABNORMAL HIGH (ref 0–149)
VLDL Cholesterol Cal: 26 mg/dL (ref 5–40)

## 2022-11-13 ENCOUNTER — Encounter: Payer: Self-pay | Admitting: *Deleted

## 2023-01-15 IMAGING — CT CT HEAD W/O CM
3 of 4 series · 13 of 47 positions shown, 15 images · non-contrast
Comparison: CT head 04/28/2021

CLINICAL DATA: Neuro deficit



[Series 2: head without · axial · non-contrast · 0.42mm/px · z∈[-172,-52]mm · 7 of 32 slices shown, 9 images]
[im 4/32  brain]
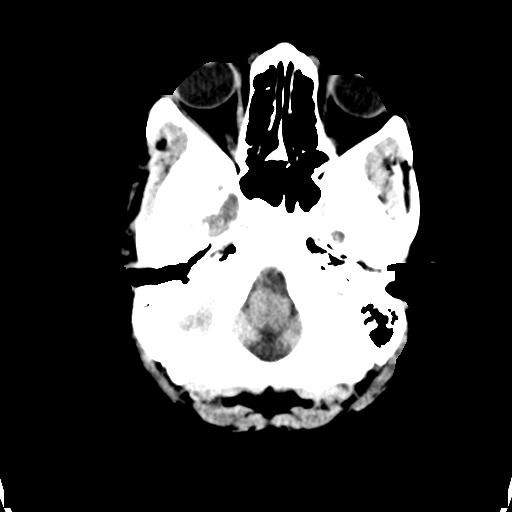
[im 4/32  bone]
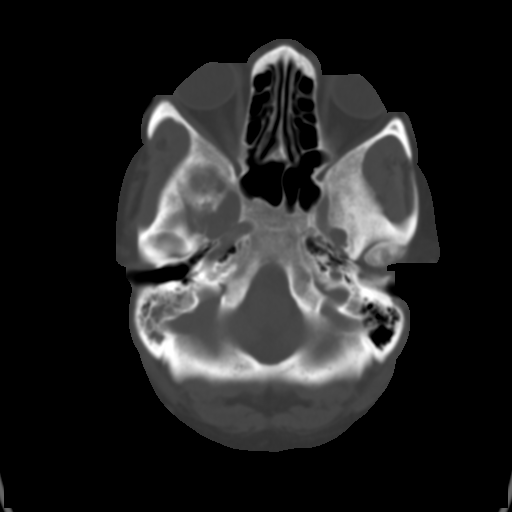
[im 8/32  brain]
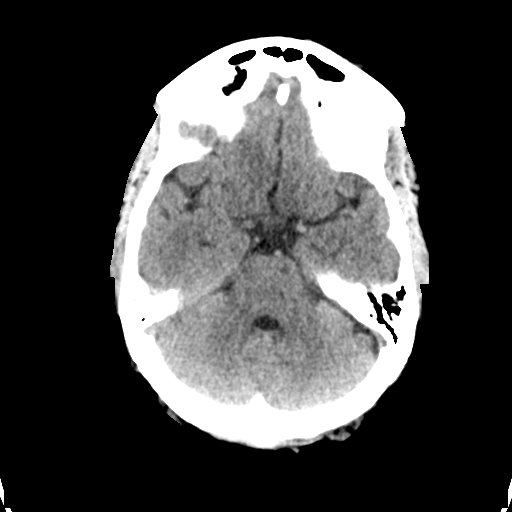
[im 12/32  brain]
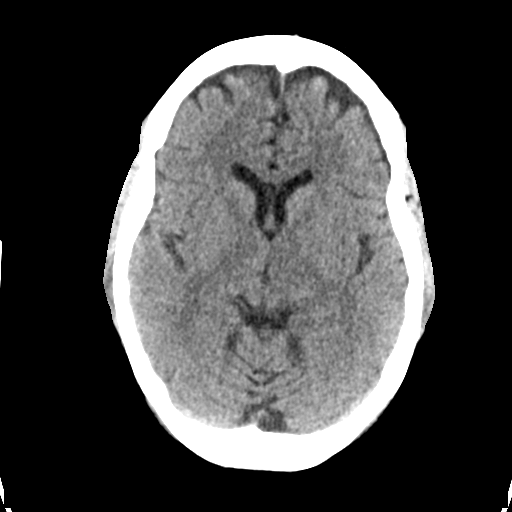
[im 16/32  brain]
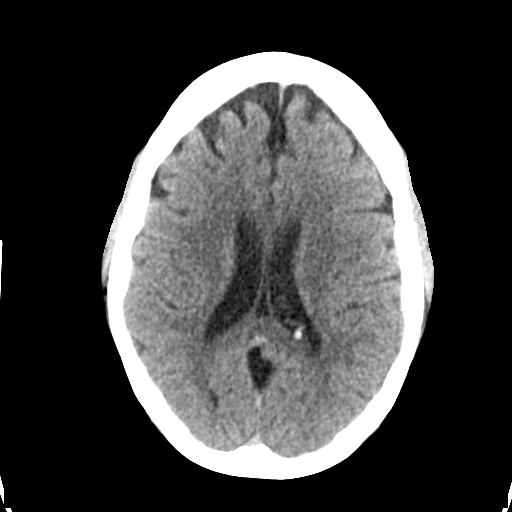
[im 20/32  brain]
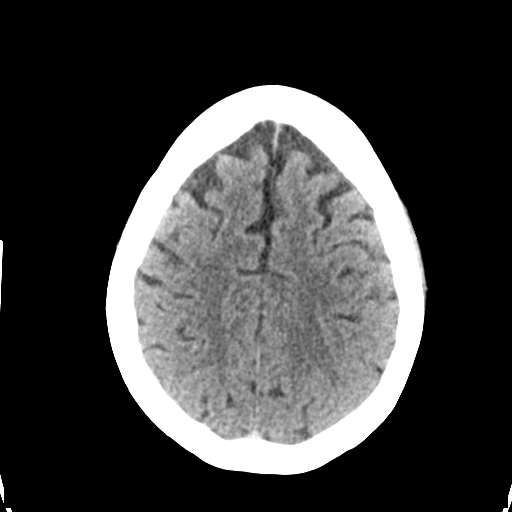
[im 20/32  bone]
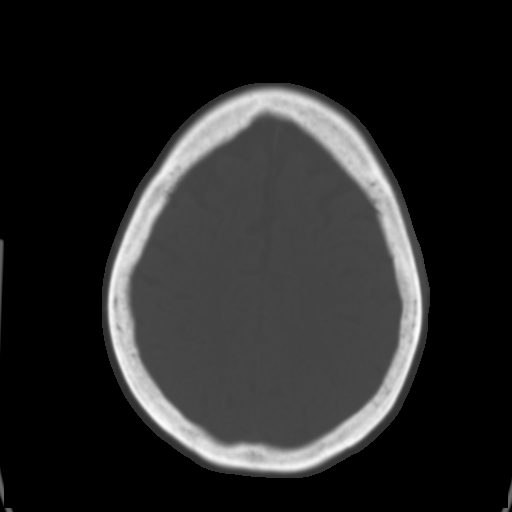
[im 24/32  brain]
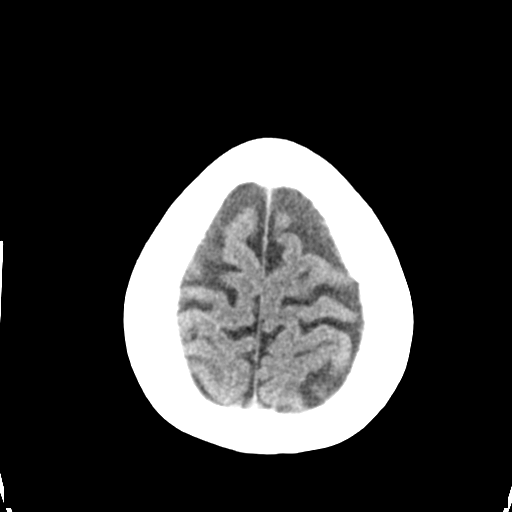
[im 28/32  brain]
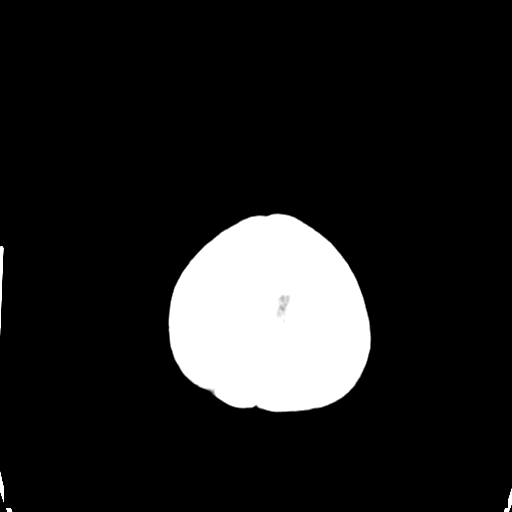

[Series 4: head without cor · coronal · non-contrast · 0.31mm/px · 3 of 71 slices shown]
[im 24/71  brain]
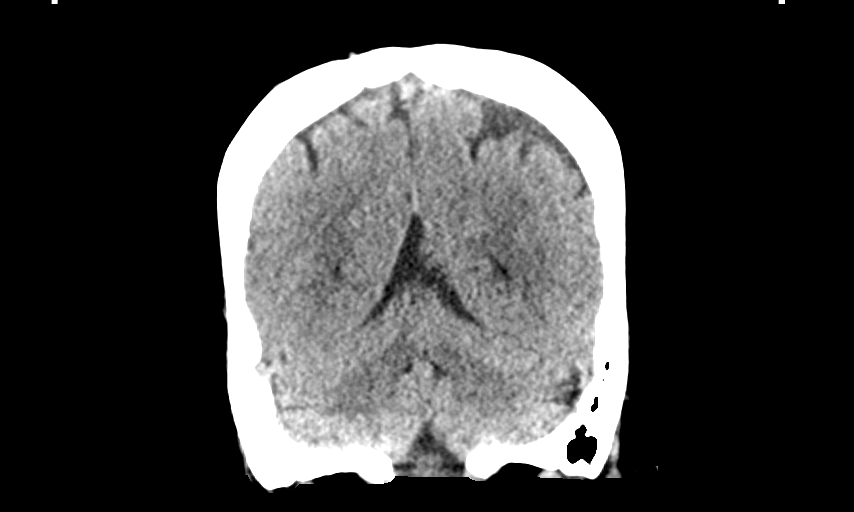
[im 32/71  brain]
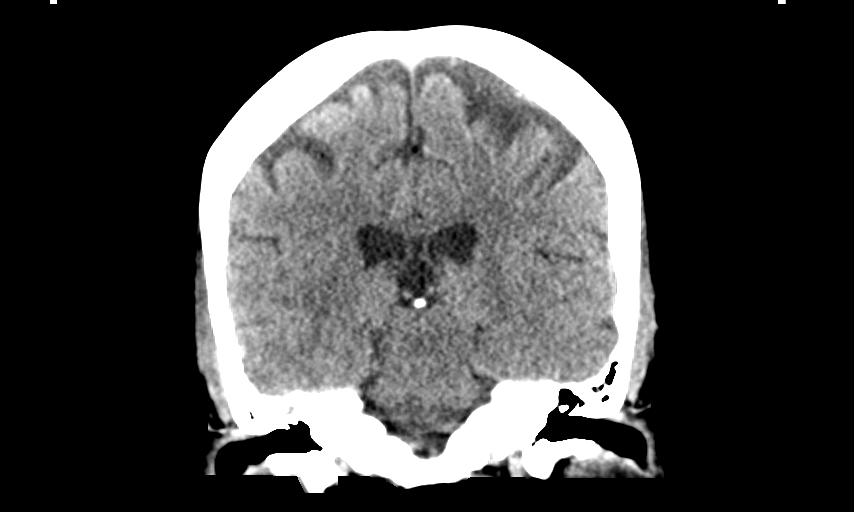
[im 39/71  brain]
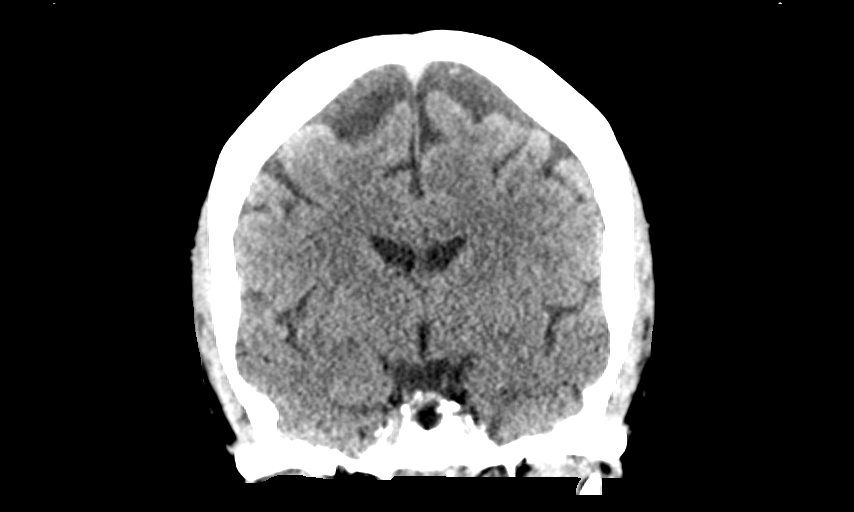

[Series 5: head without sag · sagittal · non-contrast · 0.31mm/px · 3 of 67 slices shown]
[im 23/67  brain]
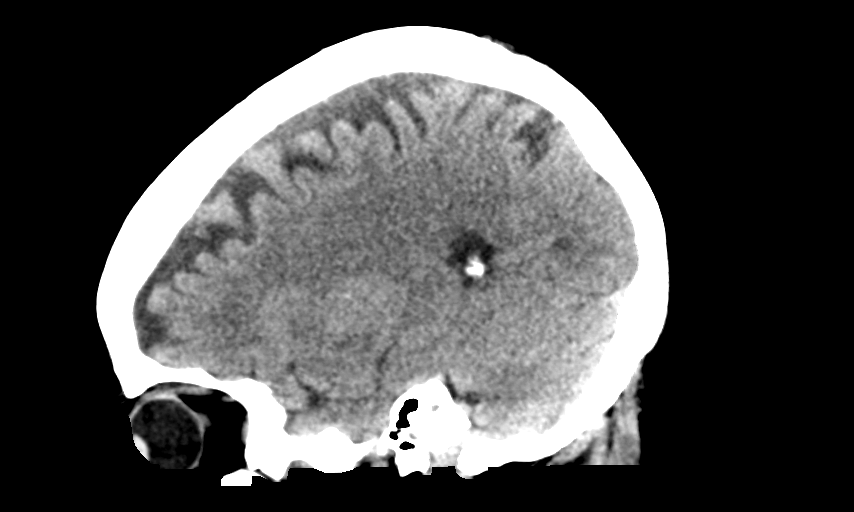
[im 34/67  brain]
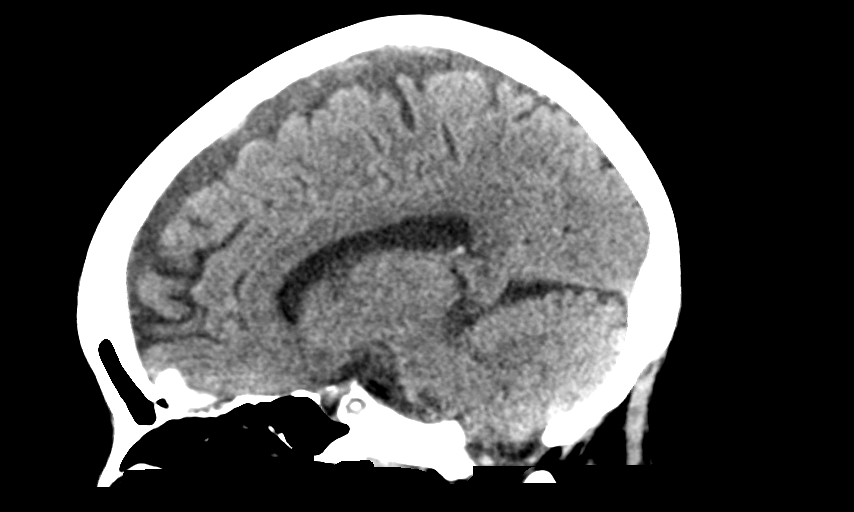
[im 45/67  brain]
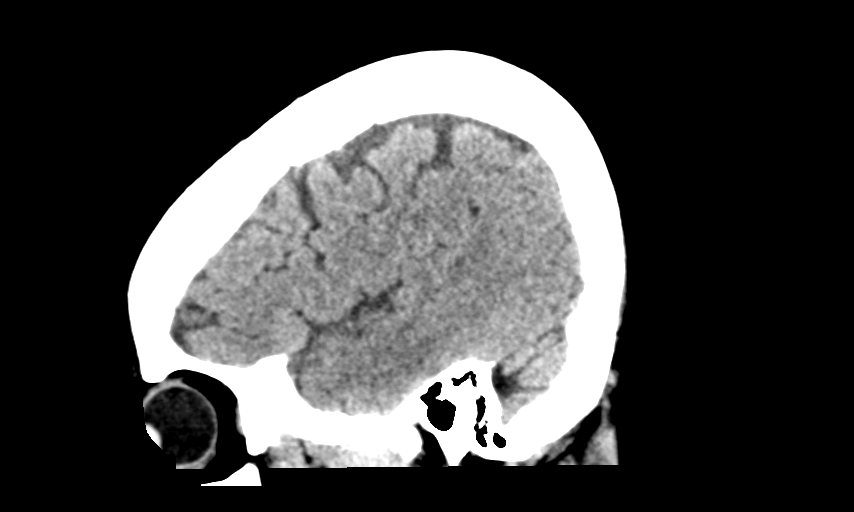

[13 of 47 positions shown; findings below may reference images not displayed]

FINDINGS: Brain: No acute intracranial hemorrhage, mass effect, or herniation.
No extra-axial fluid collections. No evidence of acute territorial
infarct. No hydrocephalus. Mild cortical volume loss.

Vascular: Calcified plaques in the carotid siphons.

Skull: Normal. Negative for fracture or focal lesion.

Sinuses/Orbits: Near-complete opacification of the right mastoid air
cells similar to previous.

Other: None.
IMPRESSION: 1. No acute intracranial process identified.
2. Stable near complete opacification of the right mastoid air
cells.

## 2023-01-25 DIAGNOSIS — M47816 Spondylosis without myelopathy or radiculopathy, lumbar region: Secondary | ICD-10-CM | POA: Diagnosis not present

## 2023-02-19 DIAGNOSIS — M47816 Spondylosis without myelopathy or radiculopathy, lumbar region: Secondary | ICD-10-CM | POA: Diagnosis not present

## 2023-10-23 ENCOUNTER — Encounter: Payer: Self-pay | Admitting: *Deleted

## 2023-10-23 NOTE — Progress Notes (Signed)
 Lauren Cantu                                          MRN: [id number]   10/23/2023   The [facility] Quality Team Specialist reviewed this patient medical record for the purposes of chart review for care gap closure. The following were reviewed: chart review for care gap closure-breast cancer screening.    [facility] Quality Team
